# Patient Record
Sex: Female | Born: 1990
Health system: Southern US, Community
[De-identification: ages and names within clinical notes are randomized; demographics above are authoritative.]

## PROBLEM LIST (undated history)

## (undated) DIAGNOSIS — Z975 Presence of (intrauterine) contraceptive device: Secondary | ICD-10-CM

## (undated) DIAGNOSIS — F419 Anxiety disorder, unspecified: Secondary | ICD-10-CM

## (undated) DIAGNOSIS — T7840XA Allergy, unspecified, initial encounter: Secondary | ICD-10-CM

## (undated) DIAGNOSIS — F1010 Alcohol abuse, uncomplicated: Secondary | ICD-10-CM

## (undated) DIAGNOSIS — F1910 Other psychoactive substance abuse, uncomplicated: Secondary | ICD-10-CM

## (undated) DIAGNOSIS — F411 Generalized anxiety disorder: Secondary | ICD-10-CM

## (undated) DIAGNOSIS — K219 Gastro-esophageal reflux disease without esophagitis: Secondary | ICD-10-CM

## (undated) DIAGNOSIS — F329 Major depressive disorder, single episode, unspecified: Secondary | ICD-10-CM

## (undated) DIAGNOSIS — F32A Depression, unspecified: Secondary | ICD-10-CM

## (undated) HISTORY — DX: Anxiety disorder, unspecified: F41.9

## (undated) HISTORY — DX: Major depressive disorder, single episode, unspecified: F32.9

## (undated) HISTORY — DX: Allergy, unspecified, initial encounter: T78.40XA

## (undated) HISTORY — DX: Depression, unspecified: F32.A

## (undated) HISTORY — DX: Alcohol abuse, uncomplicated: F10.10

## (undated) HISTORY — DX: Generalized anxiety disorder: F41.1

## (undated) HISTORY — DX: Presence of (intrauterine) contraceptive device: Z97.5

## (undated) HISTORY — DX: Other psychoactive substance abuse, uncomplicated: F19.10

## (undated) HISTORY — DX: Gastro-esophageal reflux disease without esophagitis: K21.9

## (undated) HISTORY — PX: MOUTH SURGERY: SHX715

---

## 2014-12-14 ENCOUNTER — Emergency Department (HOSPITAL_COMMUNITY)
Admission: EM | Admit: 2014-12-14 | Discharge: 2014-12-14 | Disposition: A | Discharge status: Home / Self Care | Payer: BLUE CROSS/BLUE SHIELD | Attending: Emergency Medicine | Admitting: Emergency Medicine

## 2014-12-14 ENCOUNTER — Encounter (HOSPITAL_COMMUNITY): Payer: Self-pay | Admitting: Family Medicine

## 2014-12-14 DIAGNOSIS — Z3202 Encounter for pregnancy test, result negative: Secondary | ICD-10-CM | POA: Diagnosis not present

## 2014-12-14 DIAGNOSIS — F419 Anxiety disorder, unspecified: Secondary | ICD-10-CM | POA: Diagnosis not present

## 2014-12-14 DIAGNOSIS — E860 Dehydration: Secondary | ICD-10-CM | POA: Diagnosis not present

## 2014-12-14 DIAGNOSIS — M549 Dorsalgia, unspecified: Secondary | ICD-10-CM | POA: Insufficient documentation

## 2014-12-14 DIAGNOSIS — R000 Tachycardia, unspecified: Secondary | ICD-10-CM | POA: Insufficient documentation

## 2014-12-14 DIAGNOSIS — Z880 Allergy status to penicillin: Secondary | ICD-10-CM | POA: Diagnosis not present

## 2014-12-14 DIAGNOSIS — R51 Headache: Secondary | ICD-10-CM | POA: Diagnosis not present

## 2014-12-14 DIAGNOSIS — R509 Fever, unspecified: Secondary | ICD-10-CM | POA: Insufficient documentation

## 2014-12-14 DIAGNOSIS — R1084 Generalized abdominal pain: Secondary | ICD-10-CM | POA: Diagnosis not present

## 2014-12-14 DIAGNOSIS — R42 Dizziness and giddiness: Secondary | ICD-10-CM | POA: Insufficient documentation

## 2014-12-14 DIAGNOSIS — Z8719 Personal history of other diseases of the digestive system: Secondary | ICD-10-CM | POA: Diagnosis not present

## 2014-12-14 DIAGNOSIS — R110 Nausea: Secondary | ICD-10-CM | POA: Insufficient documentation

## 2014-12-14 LAB — COMPREHENSIVE METABOLIC PANEL
ALBUMIN: 4.4 g/dL (ref 3.5–5.0)
ALT: 15 U/L (ref 14–54)
ANION GAP: 7 (ref 5–15)
AST: 21 U/L (ref 15–41)
Alkaline Phosphatase: 63 U/L (ref 38–126)
BUN: 9 mg/dL (ref 6–20)
CHLORIDE: 108 mmol/L (ref 101–111)
CO2: 24 mmol/L (ref 22–32)
Calcium: 9.6 mg/dL (ref 8.9–10.3)
Creatinine, Ser: 0.9 mg/dL (ref 0.44–1.00)
GFR calc Af Amer: >60 mL/min (ref >60)
GFR calc non Af Amer: >60 mL/min (ref >60)
GLUCOSE: 102 mg/dL — AB (ref 65–99)
POTASSIUM: 4 mmol/L (ref 3.5–5.1)
Sodium: 139 mmol/L (ref 135–145)
Total Bilirubin: 0.5 mg/dL (ref 0.3–1.2)
Total Protein: 7.4 g/dL (ref 6.5–8.1)

## 2014-12-14 LAB — CBC WITH DIFFERENTIAL/PLATELET
BASOS ABS: 0 10*3/uL (ref 0.0–0.1)
BASOS PCT: 0 %
Eosinophils Absolute: 0.1 10*3/uL (ref 0.0–0.7)
Eosinophils Relative: 1 %
HEMATOCRIT: 45.3 % (ref 36.0–46.0)
HEMOGLOBIN: 15.4 g/dL — AB (ref 12.0–15.0)
LYMPHS PCT: 28 %
Lymphs Abs: 2.6 10*3/uL (ref 0.7–4.0)
MCH: 32.4 pg (ref 26.0–34.0)
MCHC: 34 g/dL (ref 30.0–36.0)
MCV: 95.2 fL (ref 78.0–100.0)
MONO ABS: 0.6 10*3/uL (ref 0.1–1.0)
Monocytes Relative: 6 %
NEUTROS ABS: 6.2 10*3/uL (ref 1.7–7.7)
NEUTROS PCT: 65 %
Platelets: 233 10*3/uL (ref 150–400)
RBC: 4.76 MIL/uL (ref 3.87–5.11)
RDW: 12.5 % (ref 11.5–15.5)
WBC: 9.5 10*3/uL (ref 4.0–10.5)

## 2014-12-14 LAB — URINALYSIS, ROUTINE W REFLEX MICROSCOPIC
Bilirubin Urine: NEGATIVE
GLUCOSE, UA: NEGATIVE mg/dL
Hgb urine dipstick: NEGATIVE
KETONES UR: 15 mg/dL — AB
LEUKOCYTES UA: NEGATIVE
NITRITE: NEGATIVE
PH: 6.5 (ref 5.0–8.0)
PROTEIN: NEGATIVE mg/dL
Specific Gravity, Urine: 1.007 (ref 1.005–1.030)
Urobilinogen, UA: 0.2 mg/dL (ref 0.0–1.0)

## 2014-12-14 LAB — PREGNANCY, URINE: Preg Test, Ur: NEGATIVE

## 2014-12-14 MED ORDER — DIPHENHYDRAMINE HCL 50 MG/ML IJ SOLN
25.0000 mg | Freq: Once | INTRAMUSCULAR | Status: AC
  Administered 2014-12-14: 25 mg via INTRAVENOUS
  Filled 2014-12-14: qty 1

## 2014-12-14 MED ORDER — SODIUM CHLORIDE 0.9 % IV BOLUS (SEPSIS)
1000.0000 mL | Freq: Once | INTRAVENOUS | Status: AC
  Administered 2014-12-14: 1000 mL via INTRAVENOUS

## 2014-12-14 MED ORDER — METOCLOPRAMIDE HCL 5 MG/ML IJ SOLN
10.0000 mg | Freq: Once | INTRAMUSCULAR | Status: AC
  Administered 2014-12-14: 10 mg via INTRAVENOUS
  Filled 2014-12-14: qty 2

## 2014-12-14 NOTE — ED Notes (Signed)
Patient left at this time with all belongings. 

## 2014-12-14 NOTE — ED Notes (Signed)
Patient reports abdominal pain and nausea since last Thursday, reports generalized "itching" and "trouble focusing" since Monday. Pt alert, oriented, resp e/u, skin warm and dry. Pt ambulated to restroom with no issues, appeared steady on her feet.

## 2014-12-14 NOTE — ED Notes (Signed)
Pt having mid right sided abd pain with fever, confusion and tachycardia. sts some nausea.

## 2014-12-14 NOTE — Discharge Instructions (Signed)

## 2014-12-14 NOTE — ED Notes (Signed)
MD at bedside. 

## 2014-12-14 NOTE — ED Provider Notes (Signed)
CSN: [id number]     Arrival date & time 12/14/14  1448 History   First MD Initiated Contact with Patient 12/14/14 1622     Chief Complaint  Patient presents with  . Fever  . Abdominal Pain     (Consider location/radiation/quality/duration/timing/severity/associated sxs/prior Treatment) Patient is a 24 y.o. female presenting with abdominal pain. The history is provided by the patient.  Abdominal Pain Pain location:  Generalized Pain quality: aching and cramping   Pain severity:  Moderate Onset quality:  Gradual Duration:  6 days Timing:  Intermittent Progression:  Waxing and waning Chronicity:  Recurrent Context: recent sexual activity   Context: not diet changes, not eating, not laxative use, not medication withdrawal, not previous surgeries, not recent illness, not retching, not sick contacts, not suspicious food intake and not trauma   Context comment:  Constipation, stress, reduced PO intake Relieved by:  None tried Worsened by:  Nothing tried Ineffective treatments:  None tried Associated symptoms: chills, constipation, fever and nausea   Associated symptoms: no chest pain, no cough, no diarrhea, no dysuria, no fatigue, no flatus, no hematemesis, no hematochezia, no hematuria, no melena, no shortness of breath, no sore throat, no vaginal bleeding, no vaginal discharge and no vomiting   Risk factors: has not had multiple surgeries, no NSAID use, not obese and not pregnant     History reviewed. No pertinent past medical history. History reviewed. No pertinent past surgical history. History reviewed. No pertinent family history. Social History  Substance Use Topics  . Smoking status: Current Every Day Smoker  . Smokeless tobacco: None  . Alcohol Use: Yes   OB History    No data available     Review of Systems  Constitutional: Positive for fever and chills. Negative for fatigue.  HENT: Negative for sore throat.   Respiratory: Negative for cough, chest tightness and  shortness of breath.   Cardiovascular: Negative for chest pain, palpitations and leg swelling.  Gastrointestinal: Positive for nausea, abdominal pain and constipation. Negative for vomiting, diarrhea, blood in stool, melena, hematochezia, abdominal distention, anal bleeding, rectal pain, flatus and hematemesis.  Genitourinary: Negative for dysuria, hematuria, vaginal bleeding and vaginal discharge.  Musculoskeletal: Positive for back pain. Negative for myalgias, joint swelling, arthralgias, gait problem, neck pain and neck stiffness.  Skin: Negative for rash.  Neurological: Positive for dizziness, light-headedness and headaches. Negative for tremors, seizures, syncope, speech difficulty, weakness and numbness.  Psychiatric/Behavioral: Positive for decreased concentration. Negative for confusion and sleep disturbance. The patient is nervous/anxious.       Allergies  Penicillins  Home Medications   Prior to Admission medications   Medication Sig Start Date End Date Taking? Authorizing Provider  ibuprofen (ADVIL,MOTRIN) 200 MG tablet Take 400 mg by mouth every 6 (six) hours as needed.   Yes Historical Provider, MD  levonorgestrel (MIRENA) 20 MCG/24HR IUD 1 each by Intrauterine route once.   Yes Historical Provider, MD   BP 131/76 mmHg  Pulse 110  Temp(Src) 99.5 F (37.5 C) (Oral)  Resp 16  Ht  (1.676 m)  Wt 181 lb (82.101 kg)  BMI 29.23 kg/m2  SpO2 100% Physical Exam  Constitutional: She is oriented to person, place, and time. She appears well-developed and well-nourished. No distress.  HENT:  Head: Normocephalic and atraumatic.  Right Ear: External ear normal.  Left Ear: External ear normal.  Nose: Nose normal.  Mouth/Throat: Oropharynx is clear and moist. No oropharyngeal exudate.  Eyes: Conjunctivae and EOM are normal. Pupils are equal, round,  and reactive to light. Right eye exhibits no discharge. Left eye exhibits no discharge. No scleral icterus.  Neck: Normal range of  motion. Neck supple. No JVD present. No tracheal deviation present. No thyromegaly present.  Cardiovascular: Regular rhythm and intact distal pulses.  Tachycardia present.  Exam reveals friction rub. Exam reveals no gallop.   No murmur heard. Pulmonary/Chest: Effort normal and breath sounds normal. No stridor. No respiratory distress. She has no wheezes. She has no rales. She exhibits no tenderness.  Abdominal: Soft. She exhibits no distension. There is no tenderness.  Musculoskeletal: Normal range of motion. She exhibits no edema or tenderness.  Lymphadenopathy:    She has no cervical adenopathy.  Neurological: She is alert and oriented to person, place, and time. She is not disoriented. She displays no atrophy and no tremor. No cranial nerve deficit or sensory deficit. She exhibits normal muscle tone. She displays no seizure activity. Coordination and gait normal. GCS eye subscore is 4. GCS verbal subscore is 5. GCS motor subscore is 6.  Skin: Skin is warm and dry. No rash noted. She is not diaphoretic. No erythema. No pallor.  Psychiatric: Her speech is normal and behavior is normal. Judgment and thought content normal. Her mood appears anxious. Cognition and memory are normal.  Pt mildly anxious on exam, improved with discussion  Nursing note and vitals reviewed.   ED Course  Procedures (including critical care time) Labs Review Labs Reviewed  COMPREHENSIVE METABOLIC PANEL - Abnormal; Notable for the following:    Glucose, Bld 102 (*)    All other components within normal limits  URINALYSIS, ROUTINE W REFLEX MICROSCOPIC (NOT [facility]) - Abnormal; Notable for the following:    Ketones, ur 15 (*)    All other components within normal limits  CBC WITH DIFFERENTIAL/PLATELET - Abnormal; Notable for the following:    Hemoglobin 15.4 (*)    All other components within normal limits  PREGNANCY, URINE  POC OCCULT BLOOD, ED     MDM   Final diagnoses:  Dehydration  H/O constipation   Abdominal pain, generalized    Andrea Esparza is a 24 y.o. female patient presenting for recent itching, abd pain, and back pain associated with malaise and dizziness.  She denies CP, or SOB.  Pt has been under more stress lately looking for a new job.  Pt endorses mild nausea.  Hx of constipation in the past associated with stress.  No BRBPR.  No vaginal or urinary symptoms.  No CVA TTP.  No hx of syncope or presyncope.  Suspect possible dehydration and component of possible IBS.  Pt had recent labs at an UC and was "nervous" after looking at her symptoms on [employer] and was worried about liver disease.    Will screen for UTI with low back pain and abd pain, w/ hx of constipation.  IVF, benadryl, and antiemetic for pt symptoms.  Possible d/c w/ atarax or benadryl for her symptoms.  HR improved after talking about concerns.  Doubt PE as pt is w/o CP or SOB.    Symptoms resolved with IVF and MG cocktail.  Pt now w/o puritis or dizziness.  Pt HR improved with IVF.  Recommended increased fiber for IBS symptoms and short course of miralax for recent constipation and mild generalized abd pain.  Patient was given return precautions for abdominal pain and dizziness.  Pt advised on use of medications as applicable.  Advised to return for actely worsening symptoms, inability to take medications, or other acute  concerns.  Advised to follow up with PCP in 1 week.  Patient was in agreement with and expressed understanding of follow plan, plan of care, and return precautions.  All questions answered prior to discharge.  Patient was discharged in stable condition, ambulating without difficulty.   Patient care was discussed with my attending, Dr. Dione Booze, MD.        Gavin Pound, MD 12/15/14 7829  Dione Booze, MD 12/15/14 2258

## 2015-01-04 ENCOUNTER — Encounter: Payer: Self-pay | Admitting: Osteopathic Medicine

## 2015-01-04 ENCOUNTER — Ambulatory Visit (INDEPENDENT_AMBULATORY_CARE_PROVIDER_SITE_OTHER): Payer: BLUE CROSS/BLUE SHIELD | Admitting: Osteopathic Medicine

## 2015-01-04 VITALS — BP 135/73 | HR 98 | Ht 66.0 in | Wt 180.0 lb

## 2015-01-04 DIAGNOSIS — F1010 Alcohol abuse, uncomplicated: Secondary | ICD-10-CM | POA: Diagnosis not present

## 2015-01-04 DIAGNOSIS — Z8249 Family history of ischemic heart disease and other diseases of the circulatory system: Secondary | ICD-10-CM

## 2015-01-04 DIAGNOSIS — Z975 Presence of (intrauterine) contraceptive device: Secondary | ICD-10-CM | POA: Insufficient documentation

## 2015-01-04 DIAGNOSIS — G8929 Other chronic pain: Secondary | ICD-10-CM

## 2015-01-04 DIAGNOSIS — Z113 Encounter for screening for infections with a predominantly sexual mode of transmission: Secondary | ICD-10-CM

## 2015-01-04 DIAGNOSIS — R1010 Upper abdominal pain, unspecified: Secondary | ICD-10-CM

## 2015-01-04 DIAGNOSIS — F329 Major depressive disorder, single episode, unspecified: Secondary | ICD-10-CM

## 2015-01-04 DIAGNOSIS — R1011 Right upper quadrant pain: Secondary | ICD-10-CM

## 2015-01-04 DIAGNOSIS — F411 Generalized anxiety disorder: Secondary | ICD-10-CM

## 2015-01-04 HISTORY — DX: Presence of (intrauterine) contraceptive device: Z97.5

## 2015-01-04 HISTORY — DX: Alcohol abuse, uncomplicated: F10.10

## 2015-01-04 LAB — CBC WITH DIFFERENTIAL/PLATELET
Basophils Absolute: 0 K/uL (ref 0.0–0.1)
Basophils Relative: 0 % (ref 0–1)
Eosinophils Absolute: 0.1 K/uL (ref 0.0–0.7)
Eosinophils Relative: 1 % (ref 0–5)
HCT: 46.9 % — ABNORMAL HIGH (ref 36.0–46.0)
Hemoglobin: 15.7 g/dL — ABNORMAL HIGH (ref 12.0–15.0)
Lymphocytes Relative: 24 % (ref 12–46)
Lymphs Abs: 2.1 K/uL (ref 0.7–4.0)
MCH: 32 pg (ref 26.0–34.0)
MCHC: 33.5 g/dL (ref 30.0–36.0)
MCV: 95.5 fL (ref 78.0–100.0)
MPV: 9.4 fL (ref 8.6–12.4)
Monocytes Absolute: 0.7 K/uL (ref 0.1–1.0)
Monocytes Relative: 8 % (ref 3–12)
Neutro Abs: 6 K/uL (ref 1.7–7.7)
Neutrophils Relative %: 67 % (ref 43–77)
Platelets: 231 K/uL (ref 150–400)
RBC: 4.91 MIL/uL (ref 3.87–5.11)
RDW: 12.7 % (ref 11.5–15.5)
WBC: 8.9 K/uL (ref 4.0–10.5)

## 2015-01-04 LAB — LIPASE: Lipase: 15 U/L (ref 7–60)

## 2015-01-04 LAB — LIPID PANEL
CHOL/HDL RATIO: 3.6 ratio (ref <=5.0)
Cholesterol: 213 mg/dL — ABNORMAL HIGH (ref 125–200)
HDL: 60 mg/dL (ref >=46)
LDL Cholesterol: 138 mg/dL — ABNORMAL HIGH (ref <130)
TRIGLYCERIDES: 76 mg/dL (ref <150)
VLDL: 15 mg/dL (ref <30)

## 2015-01-04 LAB — TSH: TSH: 2.054 u[IU]/mL (ref 0.350–4.500)

## 2015-01-04 MED ORDER — PAROXETINE HCL 10 MG PO TABS: 10.0000 mg | ORAL_TABLET | Freq: Every day | ORAL | Status: DC

## 2015-01-04 NOTE — Progress Notes (Signed)
HPI: Andrea Esparza is a 24 y.o. female who presents to [facility]  today for chief complaint of:  Chief Complaint  Patient presents with  . Establish Care    DISCUSS ANXIETY AND DEPRESSION   ER records reviewed: Patient seen 12/14/2014 or complaint of nausea, abdominal pain, constipation, lightheadedness, decreased concentration and anxiety. Patient was discharged after being treated for dehydration, recommended increased fiber and advice on possible irritable bowel symptoms. Initially seen by nurse at work, c/o fever, was told to go to ER for concern for appendicitis.   Patient notes additional concerns including alcohol use about 12 drinks per week for about 3 years (feels like uses it as crutch to deal with her anxiety), interested in being screened for sexually transmitted disease, concern for anxiety and depression.  Anxiety Evaluation:  Excessive anxiety/worry 6 mos+: Yes  Home and work/school: Yes  Difficulty to control: Yes  Associated symptoms (3):     Restlessness/on edge Yes      Fatigue Yes      Concentration Yes      Irritable Yes      Muscle tension Yes      Sleep disturbance Yes - can't seem to get her mind to shut off, occsionally self-medicate w/ EtOH Imparled social/occupational functioning: Yes  Any secondary cause or other concerns?   Major depression Concern  Personality d/o No concern  Social anxiety d/o No concern  Obsessive Compulsive d/o No concern  PTSD No concern  Eating d/o or Body Dysmorphic d/o Concern, hx of eating disorder in high school  Major Depression Evaluation: One of the following for 2wk, most of the day on most days?  Depression: Yes    Anhedonia/reduced interest: Yes  Four of the following, most of the day on most days?  Weight change unintentional: No   Sleep pattern change: Yes   Psychomotor symptom: No   Fatigue: Yes   Worthlessness/guilt: Yes   Concentration problems: Yes   Thoughts of  death/suicide: No  Concern for other causes?  Drug-induced problems: No   Bipolar disorder: No   Delusional: No   Hallucinations: No   Bereavement/grief: No   Hypothyroid: No   Alcohol: Yes   Previous treatments: Zoloft when younger, never seemed to make a difference, lots of other situational problems at the time. Has been to counseling intermittently.   Abdominal pain:  . Location: generalized but worse in RUQ . Quality: aching, cramping, bloated . Severity: moderate . Duration: several years, worse since 5 days or so . Timing: on and off . Assoc signs/symptoms: constipation      Past medical, social and family history reviewed: Past Medical History  Diagnosis Date  . Alcohol abuse 01/04/2015    Reports 12 drinks per week and self/others concerned about this use   . IUD (intrauterine device) in place 01/04/2015   History reviewed. No pertinent past surgical history. Social History  Substance Use Topics  . Smoking status: Current Every Day Smoker  . Smokeless tobacco: Not on file  . Alcohol Use: Yes   History reviewed. No pertinent family history.  Current Outpatient Prescriptions  Medication Sig Dispense Refill  . ibuprofen (ADVIL,MOTRIN) 200 MG tablet Take 400 mg by mouth every 6 (six) hours as needed.    Marland Kitchen levonorgestrel (MIRENA) 20 MCG/24HR IUD 1 each by Intrauterine route once.     No current facility-administered medications for this visit.   Allergies  Allergen Reactions  . Penicillins Other (See Comments)  Review of Systems: CONSTITUTIONAL:  No  fever, no chills, No  unintentional weight changes HEAD/EYES/EARS/NOSE/THROAT: (+) headache, no vision change, no hearing change, No  sore throat CARDIAC: No chest pain, no pressure/palpitations, no orthopnea RESPIRATORY: ocasional cough, No  shortness of breath/wheeze GASTROINTESTINAL: No nausea, no vomiting, (+) abdominal pain on R side under ribs, no blood in stool, no diarrhea, no  constipation MUSCULOSKELETAL: Yes  Myalgia/arthralgia - aches and spasms in muscles GENITOURINARY: No incontinence, No abnormal genital bleeding/discharge, (+) interested in being tested for STI SKIN: No rash/wounds/concerning lesions HEM/ONC: No easy bruising/bleeding, no abnormal lymph node ENDOCRINE: No polyuria/polydipsia/polyphagia, no heat/cold intolerance  NEUROLOGIC: No weakness, no dizziness, no slurred speech PSYCHIATRIC: (+) concerns with depression, (+) concerns with anxiety, (+) sleep problems    Exam:  BP 135/73 mmHg  Pulse 98  Ht 5\' 6"  (1.676 m)  Wt 180 lb (81.647 kg)  BMI 29.07 kg/m2 Constitutional: VSS, see above. General Appearance: alert, well-developed, well-nourished, NAD Eyes: Normal lids and conjunctive, non-icteric sclera, PERRLA Ears, Nose, Mouth, Throat: Normal external inspection ears/nares/mouth/lips/gums, MMM, posterior pharynx No  erythema No  exudate Neck: No masses, trachea midline. No thyroid enlargement/tenderness/mass appreciated. No lymphadenopathy Respiratory: Normal respiratory effort. no wheeze, no rhonchi, no rales Cardiovascular: S1/S2 normal, no murmur, no rub/gallop auscultated. RRR.  No carotid bruit or JVD. No abdominal aortic bruit.  Pedal pulse II/IV bilaterally DP and PT.  No lower extremity edema. Gastrointestinal: (+)TTP RUQ Neg Murphy's, liver edge palpable just below ribs, no masses. No hepatomegaly, no splenomegaly. No hernia appreciated. Bowel sounds normal. Rectal exam deferred.  Musculoskeletal: Gait normal. No clubbing/cyanosis of digits.  Neurological: No cranial nerve deficit on limited exam. Motor and sensation intact and symmetric Psychiatric: Normal judgment/insight. Normal mood and affect. Oriented x3.    No results found for this or any previous visit (from the past 72 hour(s)). ER labs reviewed: CMP no concerns, CBC mildly elevated hemoglobin, urinalysis normal except positive ketones, pregnancy  negative.   ASSESSMENT/PLAN: Patient meets criteria for major depression and generalized anxiety disorder, starting therapy as below, patient given written instructions, see patient instructions. We will also complete lab workup, ER labs reviewed, no concerns on CMP, mildly elevated hemoglobin on CBC, no thyroid testing or lipase done. Patient also interested in screening for sexually transmitted infection, we'll get these labs done as well. Patient advised on side effects of medication, as well as ER precautions for thoughts of hurting self or others. Patient has good insight into her issues, realizes that she is probably self-medicating with alcohol use, she has a quit date for stopping smoking. Consideration for abdominal complaints due to possible IBS component versus application of anxiety/depression, will rule out right upper quadrant abnormality with ultrasound, CMP reviewed and no concerns.   Major depression, chronic (HCC) - Plan: TSH, CBC with Differential/Platelet, PARoxetine (PAXIL) 10 MG tablet  Generalized anxiety disorder - Plan: TSH, CBC with Differential/Platelet, PARoxetine (PAXIL) 10 MG tablet  IUD (intrauterine device) in place  Alcohol abuse  Screen for STD (sexually transmitted disease) - Plan: HIV antibody, GC/chlamydia probe amp, urine, RPR, Hepatitis panel, acute  Abdominal pain, chronic, right upper quadrant - Plan: CBC with Differential/Platelet, Lipase, US Abdomen Limited RUQ  Family history of heart disease - Plan: Lipid panel   Return in about 4 weeks (around 02/01/2015), or if symptoms worsen or fail to improve, for MEDICATION MANAGEMENT.

## 2015-01-04 NOTE — Patient Instructions (Signed)
We are starting a medication for anxiety/depression called Paxil also known as Paroxetine. We are starting at 10 mg dose for 2 weeks, then can increase to 20 mg (2 pills per day) taken in the evening. After 4-6 weeks, please follow up in the office so we can discuss how the medication is doing for you and consider dose adjustment if needed. If you're having problems with the medication before then, please let Dr. Lyn HollingsheadAlexander know right away.   You should hear about scheduling the ultrasound of the abdomen, if you do not hear about this in the next few days, please call the office.

## 2015-01-05 LAB — HEPATITIS PANEL, ACUTE
HCV AB: NEGATIVE
HEP A IGM: NONREACTIVE
HEP B C IGM: NONREACTIVE
Hepatitis B Surface Ag: NEGATIVE

## 2015-01-05 LAB — GC/CHLAMYDIA PROBE AMP, URINE
Chlamydia, Swab/Urine, PCR: NEGATIVE
GC Probe Amp, Urine: NEGATIVE

## 2015-01-05 LAB — HIV ANTIBODY (ROUTINE TESTING W REFLEX): HIV: NONREACTIVE

## 2015-01-05 LAB — RPR

## 2015-01-09 ENCOUNTER — Ambulatory Visit (INDEPENDENT_AMBULATORY_CARE_PROVIDER_SITE_OTHER): Payer: BLUE CROSS/BLUE SHIELD

## 2015-01-09 DIAGNOSIS — R1010 Upper abdominal pain, unspecified: Secondary | ICD-10-CM

## 2015-01-09 DIAGNOSIS — G8929 Other chronic pain: Secondary | ICD-10-CM

## 2015-01-09 DIAGNOSIS — R110 Nausea: Secondary | ICD-10-CM

## 2015-01-09 DIAGNOSIS — R1011 Right upper quadrant pain: Principal | ICD-10-CM

## 2015-01-18 ENCOUNTER — Encounter: Payer: Self-pay | Admitting: Osteopathic Medicine

## 2015-01-18 ENCOUNTER — Ambulatory Visit (INDEPENDENT_AMBULATORY_CARE_PROVIDER_SITE_OTHER): Payer: BLUE CROSS/BLUE SHIELD | Admitting: Osteopathic Medicine

## 2015-01-18 VITALS — BP 117/41 | HR 86 | Ht 66.0 in | Wt 182.0 lb

## 2015-01-18 DIAGNOSIS — F329 Major depressive disorder, single episode, unspecified: Secondary | ICD-10-CM

## 2015-01-18 DIAGNOSIS — F411 Generalized anxiety disorder: Secondary | ICD-10-CM

## 2015-01-18 DIAGNOSIS — R079 Chest pain, unspecified: Secondary | ICD-10-CM | POA: Diagnosis not present

## 2015-01-18 MED ORDER — ESCITALOPRAM OXALATE 20 MG PO TABS: 10.0000 mg | ORAL_TABLET | Freq: Every day | ORAL | Status: DC

## 2015-01-18 NOTE — Progress Notes (Signed)
HPI: Andrea ClarityCaitlin Esparza is a 24 y.o. female who [facility] [place] for chief complaint of:  Chief Complaint  Patient presents with  . Chest Pain    . Location: chest . Quality: soreness . Severity:  mild . Duration: 1 day  . Timing: intermittent . Context: recently started Paxil for anxiety/depression. Chest pain listed as side effect in 3% acc to [employer]. Chest pain last night at rest, nagging soreness . Assoc signs/symptoms: no SOB, no dizziness, no LOC. Noticed tension in muscles since Paxil started noticeable in neck and shoulders.    Past medical, social and family history reviewed: Past Medical History  Diagnosis Date  . Alcohol abuse 01/04/2015    Reports 12 drinks per week and self/others concerned about this use   . IUD (intrauterine device) in place 01/04/2015   Past Surgical History  Procedure Laterality Date  . Mouth surgery  2004/2005   Social History  Substance Use Topics  . Smoking status: Current Every Day Smoker  . Smokeless tobacco: Not on file  . Alcohol Use: Yes   Family History  Problem Relation Age of Onset  . Depression Father   . Alcohol abuse Maternal Aunt   . Alcohol abuse Maternal Uncle   . Cancer Maternal Grandfather   . Diabetes Paternal Grandmother   . Alcohol abuse Paternal Grandfather   . Heart attack Paternal Grandfather     Current Outpatient Prescriptions  Medication Sig Dispense Refill  . levonorgestrel (MIRENA) 20 MCG/24HR IUD 1 each by Intrauterine route once.    Marland Kitchen. PARoxetine (PAXIL) 10 MG tablet Take 1 tablet (10 mg total) by mouth daily. 30 tablet 1   No current facility-administered medications for this visit.   Allergies  Allergen Reactions  . Penicillins Other (See Comments)      Review of Systems: CONSTITUTIONAL:  No  fever, no chills, No  unintentional weight changes CARDIAC: (+) chest pain as per HPI, No  pressure, No palpitations,  RESPIRATORY: No  cough, No  shortness  of breath/wheeze PSYCHIATRIC: (+) concerns with depression, (+) concerns with anxiety, some sleep problems     Exam:  BP 117/41 mmHg  Pulse 86  Ht 5\' 6"  (1.676 m)  Wt 182 lb (82.555 kg)  BMI 29.39 kg/m2 Constitutional: VS see above. General Appearance: alert, well-developed, well-nourished, NAD Eyes: Normal lids and conjunctive, non-icteric sclera Respiratory: Normal respiratory effort. no wheeze, no rhonchi, no rales Cardiovascular: S1/S2 normal, no murmur, no rub/gallop auscultated. RRR.  Psychiatric: Normal judgment/insight. Normal mood and affect. Oriented x3.     EKG interpretation: Rate: 92 Rhythm: sinus No ST/T changes concerning for acute ischemia/infarct    ASSESSMENT/PLAN: Consideration that chest pain may be due to early viral syndrome vs anxiety issues but will try switch to Lexapro and see if this helps, ER precautions reviewed.   Chest pain, unspecified chest pain type - Plan: EKG 12-Lead  Major depression, chronic (HCC) - Plan: escitalopram (LEXAPRO) 20 MG tablet  Generalized anxiety disorder - Plan: escitalopram (LEXAPRO) 20 MG tablet     Return in about 4 weeks (around 02/15/2015), or if symptoms worsen or fail to improve.

## 2015-01-19 ENCOUNTER — Ambulatory Visit: Payer: BLUE CROSS/BLUE SHIELD | Admitting: Osteopathic Medicine

## 2015-02-01 ENCOUNTER — Ambulatory Visit (INDEPENDENT_AMBULATORY_CARE_PROVIDER_SITE_OTHER): Payer: BLUE CROSS/BLUE SHIELD | Admitting: Osteopathic Medicine

## 2015-02-01 DIAGNOSIS — F329 Major depressive disorder, single episode, unspecified: Secondary | ICD-10-CM

## 2015-02-01 DIAGNOSIS — F411 Generalized anxiety disorder: Secondary | ICD-10-CM

## 2015-02-01 NOTE — Progress Notes (Signed)
NO SHOW

## 2015-04-01 ENCOUNTER — Other Ambulatory Visit: Payer: Self-pay | Admitting: Osteopathic Medicine

## 2015-04-27 ENCOUNTER — Encounter: Payer: Self-pay | Admitting: Osteopathic Medicine

## 2015-04-27 ENCOUNTER — Ambulatory Visit (INDEPENDENT_AMBULATORY_CARE_PROVIDER_SITE_OTHER): Payer: BLUE CROSS/BLUE SHIELD | Admitting: Osteopathic Medicine

## 2015-04-27 ENCOUNTER — Ambulatory Visit (INDEPENDENT_AMBULATORY_CARE_PROVIDER_SITE_OTHER): Payer: BLUE CROSS/BLUE SHIELD

## 2015-04-27 VITALS — BP 129/82 | HR 99 | Ht 66.0 in | Wt 183.0 lb

## 2015-04-27 DIAGNOSIS — R05 Cough: Secondary | ICD-10-CM

## 2015-04-27 DIAGNOSIS — R059 Cough, unspecified: Secondary | ICD-10-CM

## 2015-04-27 DIAGNOSIS — F411 Generalized anxiety disorder: Secondary | ICD-10-CM

## 2015-04-27 DIAGNOSIS — F329 Major depressive disorder, single episode, unspecified: Secondary | ICD-10-CM | POA: Diagnosis not present

## 2015-04-27 DIAGNOSIS — M940 Chondrocostal junction syndrome [Tietze]: Secondary | ICD-10-CM

## 2015-04-27 DIAGNOSIS — F17203 Nicotine dependence unspecified, with withdrawal: Secondary | ICD-10-CM

## 2015-04-27 DIAGNOSIS — Z720 Tobacco use: Secondary | ICD-10-CM | POA: Insufficient documentation

## 2015-04-27 HISTORY — DX: Chondrocostal junction syndrome (tietze): M94.0

## 2015-04-27 HISTORY — DX: Cough, unspecified: R05.9

## 2015-04-27 HISTORY — DX: Nicotine dependence unspecified, with withdrawal: F17.203

## 2015-04-27 HISTORY — DX: Major depressive disorder, single episode, unspecified: F32.9

## 2015-04-27 HISTORY — DX: Generalized anxiety disorder: F41.1

## 2015-04-27 MED ORDER — ESCITALOPRAM OXALATE 20 MG PO TABS: 20.0000 mg | ORAL_TABLET | Freq: Every day | ORAL | Status: DC

## 2015-04-27 MED ORDER — BUPROPION HCL ER (SR) 150 MG PO TB12: 150.0000 mg | ORAL_TABLET | Freq: Two times a day (BID) | ORAL | Status: DC

## 2015-04-27 MED ORDER — FLUTICASONE PROPIONATE HFA 110 MCG/ACT IN AERO: 2.0000 | INHALATION_SPRAY | Freq: Two times a day (BID) | RESPIRATORY_TRACT | Status: DC

## 2015-04-27 NOTE — Patient Instructions (Signed)
SMOKING CESSATION WITH BUPROPION: 150 mg twice per day. Last dose no later than 6:00pm. Stop smoking after 5 - 7 days of treatment. See below for more information on quitting!   COUGH: Xray today, try inhaler, this may help as you are quitting smoking. This is an inhaled steroid, remember to rinse your mouth after use to prevent thrush. If cough persists, let me know and we may need to consider lung function tests or other workup.   COSTOCHONDRITIS: Take antiinflammatory such as Ibuprofen, can try 200 mg every 6 hours during the day for 1 - 2 weeks, or can use higher doses 800 mg up to every 6 hours as needed fo revere discomfort. Any severe chest pain (particularly if you also experience trouble breathing, dizziness, feeling faint, or any other concerning symptoms) needs prompt medical attention.    Smoking Cessation, Tips for Success If you are ready to quit smoking, congratulations! You have chosen to help yourself be healthier. Cigarettes bring nicotine, tar, carbon monoxide, and other irritants into your body. Your lungs, heart, and blood vessels will be able to work better without these poisons. There are many different ways to quit smoking. Nicotine gum, nicotine patches, a nicotine inhaler, or nicotine nasal spray can help with physical craving. Hypnosis, support groups, and medicines help break the habit of smoking. WHAT THINGS CAN I DO TO MAKE QUITTING EASIER?  Here are some tips to help you quit for good:  Pick a date when you will quit smoking completely. Tell all of your friends and family about your plan to quit on that date.  Do not try to slowly cut down on the number of cigarettes you are smoking. Pick a quit date and quit smoking completely starting on that day.  Throw away all cigarettes.   Clean and remove all ashtrays from your home, work, and car.  On a card, write down your reasons for quitting. Carry the card with you and read it when you get the urge to smoke.  Cleanse  your body of nicotine. Drink enough water and fluids to keep your urine clear or pale yellow. Do this after quitting to flush the nicotine from your body.  Learn to predict your moods. Do not let a bad situation be your excuse to have a cigarette. Some situations in your life might tempt you into wanting a cigarette.  Never have "just one" cigarette. It leads to wanting another and another. Remind yourself of your decision to quit.  Change habits associated with smoking. If you smoked while driving or when feeling stressed, try other activities to replace smoking. Stand up when drinking your coffee. Brush your teeth after eating. Sit in a different chair when you read the paper. Avoid alcohol while trying to quit, and try to drink fewer caffeinated beverages. Alcohol and caffeine may urge you to smoke.  Avoid foods and drinks that can trigger a desire to smoke, such as sugary or spicy foods and alcohol.  Ask people who smoke not to smoke around you.  Have something planned to do right after eating or having a cup of coffee. For example, plan to take a walk or exercise.  Try a relaxation exercise to calm you down and decrease your stress. Remember, you may be tense and nervous for the first 2 weeks after you quit, but this will pass.  Find new activities to keep your hands busy. Play with a pen, coin, or rubber band. Doodle or draw things on paper.  Brush  your teeth right after eating. This will help cut down on the craving for the taste of tobacco after meals. You can also try mouthwash.   Use oral substitutes in place of cigarettes. Try using lemon drops, carrots, cinnamon sticks, or chewing gum. Keep them handy so they are available when you have the urge to smoke.  When you have the urge to smoke, try deep breathing.  Designate your home as a nonsmoking area.  If you are a heavy smoker, ask your health care provider about a prescription for nicotine chewing gum. It can ease your  withdrawal from nicotine.  Reward yourself. Set aside the cigarette money you save and buy yourself something nice.  Look for support from others. Join a support group or smoking cessation program. Ask someone at home or at work to help you with your plan to quit smoking.  Always ask yourself, "Do I need this cigarette or is this just a reflex?" Tell yourself, "Today, I choose not to smoke," or "I do not want to smoke." You are reminding yourself of your decision to quit.  Do not replace cigarette smoking with electronic cigarettes (commonly called e-cigarettes). The safety of e-cigarettes is unknown, and some may contain harmful chemicals.  If you relapse, do not give up! Plan ahead and think about what you will do the next time you get the urge to smoke. HOW WILL I FEEL WHEN I QUIT SMOKING? You may have symptoms of withdrawal because your body is used to nicotine (the addictive substance in cigarettes). You may crave cigarettes, be irritable, feel very hungry, cough often, get headaches, or have difficulty concentrating. The withdrawal symptoms are only temporary. They are strongest when you first quit but will go away within 10-14 days. When withdrawal symptoms occur, stay in control. Think about your reasons for quitting. Remind yourself that these are signs that your body is healing and getting used to being without cigarettes. Remember that withdrawal symptoms are easier to treat than the major diseases that smoking can cause.  Even after the withdrawal is over, expect periodic urges to smoke. However, these cravings are generally short lived and will go away whether you smoke or not. Do not smoke! WHAT RESOURCES ARE AVAILABLE TO HELP ME QUIT SMOKING? Your health care provider can direct you to community resources or hospitals for support, which may include:  Group support.  Education.  Hypnosis.  Therapy.   This information is not intended to replace advice given to you by your health  care provider. Make sure you discuss any questions you have with your health care provider.   Document Released: 10/20/2003 Document Revised: 02/11/2014 Document Reviewed: 07/09/2012 Elsevier Interactive Patient Education 2016 [employer]   Bupropion sustained-release tablets (smoking cessation) What is this medicine? BUPROPION (byoo PROE pee on) is used to help people quit smoking. This medicine may be used for other purposes; ask your health care provider or pharmacist if you have questions. What should I tell my health care provider before I take this medicine? They need to know if you have any of these conditions: -an eating disorder, such as anorexia or bulimia -bipolar disorder or psychosis -diabetes or high blood sugar, treated with medication -glaucoma -head injury or brain tumor -heart disease, previous heart attack, or irregular heart beat -high blood pressure -kidney or liver disease -seizures -suicidal thoughts or a previous suicide attempt -Tourette's syndrome -weight loss -an unusual or allergic reaction to bupropion, other medicines, foods, dyes, or preservatives -breast-feeding -pregnant or  trying to become pregnant How should I use this medicine? Take this medicine by mouth with a glass of water. Follow the directions on the prescription label. You can take it with or without food. If it upsets your stomach, take it with food. Do not cut, crush or chew this medicine. Take your medicine at regular intervals. If you take this medicine more than once a day, take your second dose at least 8 hours after you take your first dose. To limit difficulty in sleeping, avoid taking this medicine at bedtime. Do not take your medicine more often than directed. Do not stop taking this medicine suddenly except upon the advice of your doctor. Stopping this medicine too quickly may cause serious side effects. A special MedGuide will be given to you by the pharmacist with each  prescription and refill. Be sure to read this information carefully each time. Talk to your pediatrician regarding the use of this medicine in children. Special care may be needed. Overdosage: If you think you have taken too much of this medicine contact a poison control center or emergency room at once. NOTE: This medicine is only for you. Do not share this medicine with others. What if I miss a dose? If you miss a dose, skip the missed dose and take your next tablet at the regular time. There should be at least 8 hours between doses. Do not take double or extra doses. What may interact with this medicine? Do not take this medicine with any of the following medications: -linezolid -MAOIs like Azilect, Carbex, Eldepryl, Marplan, Nardil, and Parnate -methylene blue (injected into a vein) -other medicines that contain bupropion like Wellbutrin This medicine may also interact with the following medications: -alcohol -certain medicines for anxiety or sleep -certain medicines for blood pressure like metoprolol, propranolol -certain medicines for depression or psychotic disturbances -certain medicines for HIV or AIDS like efavirenz, lopinavir, nelfinavir, ritonavir -certain medicines for irregular heart beat like propafenone, flecainide -certain medicines for Parkinson's disease like amantadine, levodopa -certain medicines for seizures like carbamazepine, phenytoin, phenobarbital -cimetidine -clopidogrel -cyclophosphamide -furazolidone -isoniazid -nicotine -orphenadrine -procarbazine -steroid medicines like prednisone or cortisone -stimulant medicines for attention disorders, weight loss, or to stay awake -tamoxifen -theophylline -thiotepa -ticlopidine -tramadol -warfarin This list may not describe all possible interactions. Give your health care provider a list of all the medicines, herbs, non-prescription drugs, or dietary supplements you use. Also tell them if you smoke, drink  alcohol, or use illegal drugs. Some items may interact with your medicine. What should I watch for while using this medicine? Visit your doctor or health care professional for regular checks on your progress. This medicine should be used together with a patient support program. It is important to participate in a behavioral program, counseling, or other support program that is recommended by your health care professional. Patients and their families should watch out for new or worsening thoughts of suicide or depression. Also watch out for sudden changes in feelings such as feeling anxious, agitated, panicky, irritable, hostile, aggressive, impulsive, severely restless, overly excited and hyperactive, or not being able to sleep. If this happens, especially at the beginning of treatment or after a change in dose, call your health care professional. Avoid alcoholic drinks while taking this medicine. Drinking excessive alcoholic beverages, using sleeping or anxiety medicines, or quickly stopping the use of these agents while taking this medicine may increase your risk for a seizure. Do not drive or use heavy machinery until you know how this medicine affects  you. This medicine can impair your ability to perform these tasks. Do not take this medicine close to bedtime. It may prevent you from sleeping. Your mouth may get dry. Chewing sugarless gum or sucking hard candy, and drinking plenty of water may help. Contact your doctor if the problem does not go away or is severe. Do not use nicotine patches or chewing gum without the advice of your doctor or health care professional while taking this medicine. You may need to have your blood pressure taken regularly if your doctor recommends that you use both nicotine and this medicine together. What side effects may I notice from receiving this medicine? Side effects that you should report to your doctor or health care professional as soon as possible: -allergic  reactions like skin rash, itching or hives, swelling of the face, lips, or tongue -breathing problems -changes in vision -confusion -fast or irregular heartbeat -hallucinations -increased blood pressure -redness, blistering, peeling or loosening of the skin, including inside the mouth -seizures -suicidal thoughts or other mood changes -unusually weak or tired -vomiting Side effects that usually do not require medical attention (report to your doctor or health care professional if they continue or are bothersome): -change in sex drive or performance -constipation -headache -loss of appetite -nausea -tremors -weight loss This list may not describe all possible side effects. Call your doctor for medical advice about side effects. You may report side effects to FDA at 1-800-FDA-1088. Where should I keep my medicine? Keep out of the reach of children. Store at room temperature between 20 and 25 degrees C (68 and 77 degrees F). Protect from light. Keep container tightly closed. Throw away any unused medicine after the expiration date. NOTE: This sheet is a summary. It may not cover all possible information. If you have questions about this medicine, talk to your doctor, pharmacist, or health care provider.    2016, Elsevier/Gold Standard. (2012-09-18 10:55:10)

## 2015-04-27 NOTE — Progress Notes (Signed)
HPI: Andrea Esparza is a 25 y.o. female who presents to [facility] today for chief complaint of:  Chief Complaint  Patient presents with  . Follow-up    LEXAPRO MEDICATION   SOB . Quality: SOB, thought related to allergies but getting worse . Duration: 1 week ago,   . Context: (+) smoker  . Modifying factors: seemed correlated to cigarette use . Assoc signs/symptoms: still feels breathless, still coughing. Some nausea and headache. Trying to cut back on smoking and maybe having some withdrawal symptoms. Occasional feeling like heart beating faster.    CHEST DISCOMFORT . Location: between ribs on L upper chest . Quality: sore/sharp when touched . Timing: intermittnet, rare . Modifying factors: hurts worse with putting pressure on chest, touching chest  . Assoc signs/symptoms: no dizziness, chest pressure, no ha/VC no LOC  ANXIETY - better on Lexapro, would like to conitnue thismedicine  TOBACCO ABUSE - trying to quit and would like help with this. Has tried nicotine replacement in the past but not helpful. Has cut back a bit so far.   Note: Was no-show for follow-up appointment 01/2015 not seen since that time.     Past medical, social and family history reviewed: Past Medical History  Diagnosis Date  . Alcohol abuse 01/04/2015    Reports 12 drinks per week and self/others concerned about this use   . IUD (intrauterine device) in place 01/04/2015  . Generalized anxiety disorder 04/27/2015  . Major depression, chronic (HCC) 04/27/2015   Past Surgical History  Procedure Laterality Date  . Mouth surgery  2004/2005   Social History  Substance Use Topics  . Smoking status: Current Every Day Smoker  . Smokeless tobacco: Not on file  . Alcohol Use: Yes   Family History  Problem Relation Age of Onset  . Depression Father   . Alcohol abuse Maternal Aunt   . Alcohol abuse Maternal Uncle   . Cancer Maternal Grandfather   . Diabetes Paternal  Grandmother   . Alcohol abuse Paternal Grandfather   . Heart attack Paternal Grandfather     Current Outpatient Prescriptions  Medication Sig Dispense Refill  . escitalopram (LEXAPRO) 20 MG tablet Take 1 tablet (20 mg total) by mouth daily. FOLLOW UP APPOINTMENT NEEDED FOR FURTHER REFILLS 14 tablet 0  . levonorgestrel (MIRENA) 20 MCG/24HR IUD 1 each by Intrauterine route once.     No current facility-administered medications for this visit.   Allergies  Allergen Reactions  . Penicillins Other (See Comments)      Review of Systems: CONSTITUTIONAL:  No  fever, no chills, No  unintentional weight changes HEAD/EYES/EARS/NOSE/THROAT: No  headache, no vision change, no hearing change, No  sore throat, (+) sinus pressure/drainage CARDIAC: (+) chest discomfort as per HPI , No  pressure, No palpitations, No  orthopnea RESPIRATORY: No  cough, No  shortness of breath/wheeze GASTROINTESTINAL: (+) occasional nausea, No  vomiting, No  abdominal pain, MUSCULOSKELETAL: No  Myalgia/arthralgia except chest discomfort with touching/pressure  NEUROLOGIC: No  weakness, No  dizziness, No  slurred speech PSYCHIATRIC: (+)concerns with depression, (+)concerns with anxiety, No sleep problems - reports improvement on Lexapro  Exam:  BP 129/82 mmHg  Pulse 99  Ht  (1.676 m)  Wt 183 lb (83.008 kg)  BMI 29.55 kg/m2 Constitutional: VS see above. General Appearance: alert, well-developed, well-nourished, NAD Eyes: Normal lids and conjunctive, non-icteric sclera,  Ears, Nose, Mouth, Throat: MMM, Normal external inspection ears/nares/mouth/lips/gums, TM normal bilaterally. Pharynx no erythema, no exudate.  Neck: No masses, trachea midline. No thyroid enlargement/tenderness/mass appreciated. No lymphadenopathy Respiratory: Normal respiratory effort. no wheeze, no rhonchi, no rales Cardiovascular: S1/S2 normal, no murmur, no rub/gallop auscultated. RRR. No lower extremity edema. (+) TTP intercostals on L and  at sternal border c/w costochondritis Psychiatric: Normal judgment/insight. Normal mood and affect. Oriented x3.    No results found for this or any previous visit (from the past 72 hour(s)).    ASSESSMENT/PLAN:  Major depression, chronic (HCC) - Table on Lexapro, patient doing well - Plan: escitalopram (LEXAPRO) 20 MG tablet  Generalized anxiety disorder - Table on Lexapro, patient doing well - Plan: escitalopram (LEXAPRO) 20 MG tablet  Tobacco abuse - Patient information printed, starting on Wellbutrin - Plan: buPROPion (WELLBUTRIN SR) 150 MG 12 hr tablet  Tobacco withdrawal  Cough - X-ray pending, trial ICS, patient quitting smoking, ER/RTC precautions reviewed - Plan: DG Chest 2 View, fluticasone (FLOVENT HFA) 110 MCG/ACT inhaler  Costochondritis - NSAIDs recommended, ER precautions reviewed   Return in about 6 weeks (around 06/08/2015) for MEDICATION MANAGEMENT, RECHECK BREATHING AND PROGRESS ON QUITTING SMOKING.

## 2015-05-17 ENCOUNTER — Ambulatory Visit: Payer: BLUE CROSS/BLUE SHIELD | Admitting: Osteopathic Medicine

## 2015-06-20 ENCOUNTER — Ambulatory Visit (INDEPENDENT_AMBULATORY_CARE_PROVIDER_SITE_OTHER): Payer: BLUE CROSS/BLUE SHIELD | Admitting: Osteopathic Medicine

## 2015-06-20 ENCOUNTER — Encounter: Payer: Self-pay | Admitting: Osteopathic Medicine

## 2015-06-20 VITALS — BP 115/75 | HR 80 | Ht 66.0 in | Wt 190.0 lb

## 2015-06-20 DIAGNOSIS — Z720 Tobacco use: Secondary | ICD-10-CM

## 2015-06-20 DIAGNOSIS — F329 Major depressive disorder, single episode, unspecified: Secondary | ICD-10-CM | POA: Diagnosis not present

## 2015-06-20 DIAGNOSIS — R05 Cough: Secondary | ICD-10-CM

## 2015-06-20 DIAGNOSIS — R059 Cough, unspecified: Secondary | ICD-10-CM

## 2015-06-20 DIAGNOSIS — F411 Generalized anxiety disorder: Secondary | ICD-10-CM

## 2015-06-20 NOTE — Progress Notes (Signed)
HPI: Andrea Esparza is a 25 y.o. female who presents to [facility] today for chief complaint of:  Chief Complaint  Patient presents with  . Follow-up    Depression medication   TOBACCO ABUSE - trying to quit and would like help with this. Has tried nicotine replacement in the past but not helpful. Has cut back a bit so far. Was on Wellbutrin started last visit for about 3 weeks but wasn't noticing much difference, then ended up starting smoking again  COUGH/SOB - Follow-up from previous visit . Quality: SOB, thought related to allergies but was getting worse,  . Context: (+) smoker  . Modifying factors: seemed correlated to cigarette use, as was a bit better with use of inhaler, overall is improved and patient isn't worried about cough/SOB  ANXIETY - better on Lexapro, would like to conitnue this medicine       Past medical, social and family history reviewed: Past Medical History  Diagnosis Date  . Alcohol abuse 01/04/2015    Reports 12 drinks per week and self/others concerned about this use   . IUD (intrauterine device) in place 01/04/2015  . Generalized anxiety disorder 04/27/2015  . Major depression, chronic (HCC) 04/27/2015   Past Surgical History  Procedure Laterality Date  . Mouth surgery  2004/2005   Social History  Substance Use Topics  . Smoking status: Current Every Day Smoker  . Smokeless tobacco: Not on file  . Alcohol Use: Yes   Family History  Problem Relation Age of Onset  . Depression Father   . Alcohol abuse Maternal Aunt   . Alcohol abuse Maternal Uncle   . Cancer Maternal Grandfather   . Diabetes Paternal Grandmother   . Alcohol abuse Paternal Grandfather   . Heart attack Paternal Grandfather     Current Outpatient Prescriptions  Medication Sig Dispense Refill  . buPROPion (WELLBUTRIN SR) 150 MG 12 hr tablet Take 1 tablet (150 mg total) by mouth 2 (two) times daily. 60 tablet 1  . escitalopram (LEXAPRO) 20 MG  tablet Take 1 tablet (20 mg total) by mouth daily. 30 tablet 6  . fluticasone (FLOVENT HFA) 110 MCG/ACT inhaler Inhale 2 puffs into the lungs 2 (two) times daily. For cough 1 Inhaler 0  . levonorgestrel (MIRENA) 20 MCG/24HR IUD 1 each by Intrauterine route once.     No current facility-administered medications for this visit.   Allergies  Allergen Reactions  . Penicillins Other (See Comments)      Review of Systems: CONSTITUTIONAL:  No  fever, no chills, No  unintentional weight changes HEAD/EYES/EARS/NOSE/THROAT: No  headache, no vision change RESPIRATORY: No  cough, No  shortness of breath/wheeze PSYCHIATRIC: (+)concerns with depression, (+)concerns with anxiety, No sleep problems - reports improvement on Lexapro  Exam:  BP 115/75 mmHg  Pulse 80  Ht  (1.676 m)  Wt 190 lb (86.183 kg)  BMI 30.68 kg/m2 Constitutional: VS see above. General Appearance: alert, well-developed, well-nourished, NAD Ears, Nose, Mouth, Throat: MMM, Normal external inspection ears/nares/mouth/lips/gums.  Neck: No masses, trachea midline. Respiratory: Normal respiratory effort. no wheeze, no rhonchi, no rales Cardiovascular: S1/S2 normal Psychiatric: Normal judgment/insight. Normal mood and affect. Oriented x3.       ASSESSMENT/PLAN:  Major depression, chronic (HCC) - Doing well on Lexapro, continue  Generalized anxiety disorder - Doing well on Lexapro, continue  Tobacco abuse - Started again after she stopped the Wellbutrin, advised can try again with the Wellbutrin and stay on this for at least  12 weeks  Cough - Mostly resolved, inhalers were helping, patient started smoking again. Consider PFT, most likely post-bronchitis cough/smoker cough   Return in about 5 months (around 11/20/2015) for PAP & MEDICATION FOLLOWUP.

## 2015-07-12 ENCOUNTER — Ambulatory Visit (INDEPENDENT_AMBULATORY_CARE_PROVIDER_SITE_OTHER): Payer: BLUE CROSS/BLUE SHIELD | Admitting: Osteopathic Medicine

## 2015-07-12 ENCOUNTER — Encounter: Payer: Self-pay | Admitting: Osteopathic Medicine

## 2015-07-12 VITALS — BP 135/82 | HR 98 | Ht 66.0 in | Wt 189.0 lb

## 2015-07-12 DIAGNOSIS — S91311A Laceration without foreign body, right foot, initial encounter: Secondary | ICD-10-CM

## 2015-07-12 DIAGNOSIS — S91319A Laceration without foreign body, unspecified foot, initial encounter: Secondary | ICD-10-CM | POA: Insufficient documentation

## 2015-07-12 HISTORY — DX: Laceration without foreign body, unspecified foot, initial encounter: S91.319A

## 2015-07-12 NOTE — Progress Notes (Signed)
HPI: Andrea Esparza is a 25 y.o. female who [facility] [place] today for chief complaint of:  Chief Complaint  Patient presents with  . Laceration    TOP ON RIGHT FOOT    LACERATION . Context: lacerated on metal bedframe . Location: dorsal R foot . Duration: happened >7 days ago . Modifying factors: has been using peroxide and bandaging.  . Assoc signs/symptoms: no drainage, some tingling/swelling around the laceration   Past medical, social and family history reviewed: Past Medical History  Diagnosis Date  . Alcohol abuse 01/04/2015    Reports 12 drinks per week and self/others concerned about this use   . IUD (intrauterine device) in place 01/04/2015  . Generalized anxiety disorder 04/27/2015  . Major depression, chronic (HCC) 04/27/2015   Past Surgical History  Procedure Laterality Date  . Mouth surgery  2004/2005   Social History  Substance Use Topics  . Smoking status: Current Every Day Smoker  . Smokeless tobacco: Not on file  . Alcohol Use: Yes   Family History  Problem Relation Age of Onset  . Depression Father   . Alcohol abuse Maternal Aunt   . Alcohol abuse Maternal Uncle   . Cancer Maternal Grandfather   . Diabetes Paternal Grandmother   . Alcohol abuse Paternal Grandfather   . Heart attack Paternal Grandfather     Current Outpatient Prescriptions  Medication Sig Dispense Refill  . buPROPion (WELLBUTRIN SR) 150 MG 12 hr tablet Take 1 tablet (150 mg total) by mouth 2 (two) times daily. 60 tablet 1  . escitalopram (LEXAPRO) 20 MG tablet Take 1 tablet (20 mg total) by mouth daily. 30 tablet 6  . fluticasone (FLOVENT HFA) 110 MCG/ACT inhaler Inhale 2 puffs into the lungs 2 (two) times daily. For cough 1 Inhaler 0  . levonorgestrel (MIRENA) 20 MCG/24HR IUD 1 each by Intrauterine route once.     No current facility-administered medications for this visit.   Allergies  Allergen Reactions  . Penicillins Other (See  Comments)      Review of Systems: CONSTITUTIONAL:  No  fever, no chills, No recent illness, No unintentional weight changes SKIN: No  rash/wounds/concerning lesions other than laceration as per HPI MSK: no joint pain or swelling, no fall   Exam:  BP 135/82 mmHg  Pulse 98  Ht 5\' 6"  (1.676 m)  Wt 189 lb (85.73 kg)  BMI 30.52 kg/m2 Constitutional: VS see above. General Appearance: alert, well-developed, well-nourished, NAD Musculoskeletal: Gait normal. No clubbing/cyanosis of digits. Able to move all toes normally R foot, R ankle normal ROM.  Skin: 3 cm laceration on dorsal R foot, 0.5cm wide, healing by secondary intention, granulation tissue present. Nondraining. Faint erythema around border of laceration c/w normal healing, no extended erythema or warmth or streaking. Skin otherwise warm, dry, intact. No rash/ulcer. No concerning nevi or subq nodules on limited exam.   Psychiatric: Normal judgment/insight. Normal mood and affect. Oriented x3.      ASSESSMENT/PLAN:  Laceration of foot, right, initial encounter - happened >1 weeks ago, healing by secondary intention, educated on wet-to-dry dressing and change at least twice per day. D/C peroxide use, can use antibiotic ointment and mild soap/water. Likely will scar. Possible some superficial nerve damage given tingling, or edema due to inflammation/healing. Ok to use Acetaminophen/NSAIDs for pain.    All questions were answered. Visit summary with medication list and pertinent instructions was printed for patient to review. ER/RTC precautions were reviewed with the patient. Return  for routine preventive care in 11/2015 (due for Pap), sooner if needed.

## 2016-02-09 ENCOUNTER — Ambulatory Visit (INDEPENDENT_AMBULATORY_CARE_PROVIDER_SITE_OTHER): Payer: BLUE CROSS/BLUE SHIELD | Admitting: Osteopathic Medicine

## 2016-02-09 ENCOUNTER — Encounter: Payer: Self-pay | Admitting: Osteopathic Medicine

## 2016-02-09 VITALS — BP 124/85 | HR 84 | Ht 66.0 in | Wt 197.0 lb

## 2016-02-09 DIAGNOSIS — F329 Major depressive disorder, single episode, unspecified: Secondary | ICD-10-CM | POA: Diagnosis not present

## 2016-02-09 DIAGNOSIS — M940 Chondrocostal junction syndrome [Tietze]: Secondary | ICD-10-CM

## 2016-02-09 DIAGNOSIS — Z720 Tobacco use: Secondary | ICD-10-CM

## 2016-02-09 DIAGNOSIS — F411 Generalized anxiety disorder: Secondary | ICD-10-CM | POA: Diagnosis not present

## 2016-02-09 MED ORDER — METHYLPREDNISOLONE 4 MG PO TBPK: ORAL_TABLET | ORAL | 0 refills | Status: DC

## 2016-02-09 MED ORDER — BUPROPION HCL ER (SR) 150 MG PO TB12: 150.0000 mg | ORAL_TABLET | Freq: Two times a day (BID) | ORAL | 1 refills | Status: DC

## 2016-02-09 MED ORDER — NAPROXEN 500 MG PO TABS: 500.0000 mg | ORAL_TABLET | Freq: Two times a day (BID) | ORAL | 0 refills | Status: DC

## 2016-02-09 MED ORDER — ESCITALOPRAM OXALATE 20 MG PO TABS: 20.0000 mg | ORAL_TABLET | Freq: Every day | ORAL | 1 refills | Status: DC

## 2016-02-09 NOTE — Progress Notes (Signed)
HPI: Andrea Esparza is a 26 y.o. female who [facility] [place] 02[facility]09[facility]16 for chief complaint of:  Chief Complaint  Patient presents with  . Shortness of Breath  . Cough    Acute Illness: . Context: Hx costochondritis thinks this is same. Had cold around thanksgiving That cough at that point, chest discomfort has been nagging ever since. . Location: Right upper chest . Quality: Sore/tender, worse with deep breath.  . Modifying factors: has tried the following OTC/Rx medications: none  Note, patient is switching pharmacies, would like refills on her medications for depression. Is doing well on Lexapro and Wellbutrin   Past medical, social and family history reviewed. Current medications and allergies reviewed.     Review of Systems:  Constitutional: No  fever/chills  HEENT: No  headache, No  sore throat, No  swollen glands  Cardiovascular: No chest pain  Respiratory:No  cough, no shortness of breath on exertion, soreness with taking a deep breath  Gastrointestinal: No  nausea, No  vomiting,  No  diarrhea  Skin/Integument:  No  rash   Exam:  BP 124/85   Pulse 84   Ht 5\' 6"  (1.676 m)   Wt 197 lb (89.4 kg)   BMI 31.80 kg/m   Constitutional: VSS, see above. General Appearance: alert, well-developed, well-nourished, NAD  Eyes: Normal lids and conjunctive, non-icteric sclera, PERRLA  Neck: No masses, trachea midline. normal lymph nodes  Respiratory: Normal respiratory effort. No  wheeze/rhonchi/rales  Cardiovascular: S1/S2 normal, no murmur/rub/gallop auscultated. RRR. Tenderness lateral to sternum right upper chest    ASSESSMENT/PLAN: No cough, no fever. Exam consistent with costochondritis, trial anti-inflammatories, steroids if these aren't helping. If worse, seek care ASAP. No family history of cardiac disease at young age. If no improvement next week, return for chest x-ray  Costochondritis - Plan: methylPREDNISolone (MEDROL  DOSEPAK) 4 MG TBPK tablet, naproxen (NAPROSYN) 500 MG tablet  Major depression, chronic - stable on lexapro/wellbutrin - Plan: escitalopram (LEXAPRO) 20 MG tablet  Generalized anxiety disorder - stable on lexapro/wellbutrin - Plan: escitalopram (LEXAPRO) 20 MG tablet  Tobacco abuse - wellbutrin - Plan: buPROPion (WELLBUTRIN SR) 150 MG 12 hr tablet     Patient Instructions  Chest discomfort at this point seems consistent with costochondritis rather than more concerning respiratory or cardiac issue. However, if you're not feeling at least a little bit improvement over the weekends, or if you get worse, please let us know and we will need to get chest x-ray and consider EKG. If chest pain worsens or changes, please seek medical care ASAP.    Visit summary was printed for the patient with medications and pertinent instructions for patient to review. ER/RTC precautions reviewed. All questions answered. Return if symptoms worsen or fail to improve.

## 2016-02-09 NOTE — Patient Instructions (Signed)
Chest discomfort at this point seems consistent with costochondritis rather than more concerning respiratory or cardiac issue. However, if you're not feeling at least a little bit improvement over the weekends, or if you get worse, please let us know and we will need to get chest x-ray and consider EKG. If chest pain worsens or changes, please seek medical care ASAP.

## 2016-02-13 ENCOUNTER — Telehealth: Payer: Self-pay

## 2016-02-13 DIAGNOSIS — R0789 Other chest pain: Secondary | ICD-10-CM

## 2016-02-13 NOTE — Telephone Encounter (Signed)
Patient called stated that she was seen last week for shortness of breath. Patient stated that she is any better and she is having constant heartburn. Please advise what patient needs to do. Brewer Hitchman,CMA

## 2016-02-13 NOTE — Telephone Encounter (Signed)
Patient has been informed. Rhonda Cunningham,CMA  

## 2016-02-13 NOTE — Telephone Encounter (Signed)
Would recommend that she come downstairs for a chest x-ray today if possible, I'll look this over and call her once I have the radiologists results unless something obvious shows up on the Xray. In the meantime, would try over-the-counter treatment with Tums or Pepto-Bismol if she hasn't already tried this.   If x-ray is normal and over-the-counter medications are not helpful, would recommend that she be seen back in the office for further evaluation.   (Can double-book me today or she can use one of the openings tomorrow)

## 2016-02-14 ENCOUNTER — Encounter: Payer: Self-pay | Admitting: Osteopathic Medicine

## 2016-02-14 ENCOUNTER — Encounter (HOSPITAL_COMMUNITY): Payer: Self-pay

## 2016-02-14 ENCOUNTER — Ambulatory Visit (INDEPENDENT_AMBULATORY_CARE_PROVIDER_SITE_OTHER): Payer: BLUE CROSS/BLUE SHIELD

## 2016-02-14 ENCOUNTER — Emergency Department (HOSPITAL_COMMUNITY): Payer: BLUE CROSS/BLUE SHIELD

## 2016-02-14 ENCOUNTER — Emergency Department (HOSPITAL_COMMUNITY)
Admission: EM | Admit: 2016-02-14 | Discharge: 2016-02-15 | Disposition: A | Discharge status: Home / Self Care | Payer: BLUE CROSS/BLUE SHIELD | Attending: Emergency Medicine | Admitting: Emergency Medicine

## 2016-02-14 ENCOUNTER — Ambulatory Visit (INDEPENDENT_AMBULATORY_CARE_PROVIDER_SITE_OTHER): Payer: BLUE CROSS/BLUE SHIELD | Admitting: Osteopathic Medicine

## 2016-02-14 VITALS — BP 139/77 | HR 71 | Ht 66.0 in | Wt 196.0 lb

## 2016-02-14 DIAGNOSIS — F17200 Nicotine dependence, unspecified, uncomplicated: Secondary | ICD-10-CM | POA: Diagnosis not present

## 2016-02-14 DIAGNOSIS — R12 Heartburn: Secondary | ICD-10-CM

## 2016-02-14 DIAGNOSIS — R0602 Shortness of breath: Secondary | ICD-10-CM

## 2016-02-14 DIAGNOSIS — R072 Precordial pain: Secondary | ICD-10-CM | POA: Diagnosis present

## 2016-02-14 DIAGNOSIS — J40 Bronchitis, not specified as acute or chronic: Secondary | ICD-10-CM | POA: Insufficient documentation

## 2016-02-14 DIAGNOSIS — R0789 Other chest pain: Secondary | ICD-10-CM | POA: Diagnosis not present

## 2016-02-14 DIAGNOSIS — Z79899 Other long term (current) drug therapy: Secondary | ICD-10-CM | POA: Diagnosis not present

## 2016-02-14 DIAGNOSIS — Z5181 Encounter for therapeutic drug level monitoring: Secondary | ICD-10-CM | POA: Insufficient documentation

## 2016-02-14 LAB — BASIC METABOLIC PANEL
Anion gap: 9 (ref 5–15)
BUN: 12 mg/dL (ref 6–20)
CALCIUM: 9.5 mg/dL (ref 8.9–10.3)
CO2: 23 mmol/L (ref 22–32)
CREATININE: 0.85 mg/dL (ref 0.44–1.00)
Chloride: 103 mmol/L (ref 101–111)
GFR calc Af Amer: >60 mL/min (ref >60)
GLUCOSE: 94 mg/dL (ref 65–99)
Potassium: 3.7 mmol/L (ref 3.5–5.1)
Sodium: 135 mmol/L (ref 135–145)

## 2016-02-14 LAB — PROTIME-INR
INR: 0.98
Prothrombin Time: 13 seconds (ref 11.4–15.2)

## 2016-02-14 LAB — I-STAT TROPONIN, ED: Troponin i, poc: 0 ng/mL (ref 0.00–0.08)

## 2016-02-14 LAB — CBC
HCT: 45.1 % (ref 36.0–46.0)
Hemoglobin: 15.7 g/dL — ABNORMAL HIGH (ref 12.0–15.0)
MCH: 33.2 pg (ref 26.0–34.0)
MCHC: 34.8 g/dL (ref 30.0–36.0)
MCV: 95.3 fL (ref 78.0–100.0)
PLATELETS: 235 10*3/uL (ref 150–400)
RBC: 4.73 MIL/uL (ref 3.87–5.11)
RDW: 12.3 % (ref 11.5–15.5)
WBC: 12 10*3/uL — AB (ref 4.0–10.5)

## 2016-02-14 LAB — I-STAT BETA HCG BLOOD, ED (MC, WL, AP ONLY): I-stat hCG, quantitative: <5 m[IU]/mL (ref <5)

## 2016-02-14 LAB — APTT: aPTT: 30 seconds (ref 24–36)

## 2016-02-14 LAB — D-DIMER, QUANTITATIVE (NOT AT ARMC): D DIMER QUANT: 0.57 ug{FEU}/mL — AB (ref <0.50)

## 2016-02-14 MED ORDER — IOPAMIDOL (ISOVUE-370) INJECTION 76%
INTRAVENOUS | Status: AC
  Administered 2016-02-14: 100 mL via INTRAVENOUS
  Filled 2016-02-14: qty 100

## 2016-02-14 MED ORDER — ALBUTEROL SULFATE HFA 108 (90 BASE) MCG/ACT IN AERS
1.0000 | INHALATION_SPRAY | RESPIRATORY_TRACT | Status: DC | PRN
  Administered 2016-02-15: 1 via RESPIRATORY_TRACT
  Filled 2016-02-14: qty 6.7

## 2016-02-14 NOTE — ED Provider Notes (Signed)
[facility]-EMERGENCY DEPT Provider Note   CSN: [id number] Arrival date & time: 02/14/16  2130     History   Chief Complaint Chief Complaint  Patient presents with  . positive d-dimer  . Chest Pain    HPI Andrea Esparza is a 26 y.o. female.  Patient is a 26 year old female with history of tobacco abuse presenting today with a one-month history of persistent chest pain. In November she had URI symptoms which lingered with cough, intermittent shortness of breath and occasional wheezing. She started to feel better but saw her doctor a few weeks ago. At that time she was given prednisone and NSAIDs. She took it for 3 days but then started to get severe heartburn and stopped. She states the pain is worse with deep breathing or if she sits up a long period of time and stretches. She occasionally has shortness of breath with exertion but denies any productive cough or hemoptysis. Patient was seen by PCP today and had lab work done which showed an elevated d-dimer and she was sent here to get a CAT scan   The history is provided by the patient.  Chest Pain   This is a chronic problem. Episode onset: 1 month  The problem occurs constantly. The problem has not changed since onset.The pain is associated with breathing and movement. The pain is present in the substernal region. The pain is at a severity of 4/10. The pain is moderate. The quality of the pain is described as pleuritic. The pain does not radiate. The symptoms are aggravated by certain positions and deep breathing. Associated symptoms include cough and shortness of breath. Pertinent negatives include no nausea, no syncope and no vomiting. Treatments tried: nsaids, prednisone which caused bad heartburn. The treatment provided moderate relief. Risk factors include smoking/tobacco exposure.  Pertinent negatives for past medical history include no CAD and no hypertension.    Past Medical History:  Diagnosis Date  . Alcohol abuse 01/04/2015   Reports 12 drinks per week and self/others concerned about this use   . Generalized anxiety disorder 04/27/2015  . IUD (intrauterine device) in place 01/04/2015  . Major depression, chronic 04/27/2015    Patient Active Problem List   Diagnosis Date Noted  . Laceration of foot 07/12/2015  . Major depression, chronic 04/27/2015  . Generalized anxiety disorder 04/27/2015  . Tobacco abuse 04/27/2015  . Tobacco withdrawal 04/27/2015  . Cough 04/27/2015  . Costochondritis 04/27/2015  . Alcohol abuse 01/04/2015  . IUD (intrauterine device) in place 01/04/2015    Past Surgical History:  Procedure Laterality Date  . MOUTH SURGERY  2004/2005    OB History    No data available       Home Medications    Prior to Admission medications   Medication Sig Start Date End Date Taking? Authorizing Provider  buPROPion (WELLBUTRIN SR) 150 MG 12 hr tablet Take 1 tablet (150 mg total) by mouth 2 (two) times daily. 02/09/16   Sunnie Nielsen, DO  escitalopram (LEXAPRO) 20 MG tablet Take 1 tablet (20 mg total) by mouth daily. 02/09/16   Sunnie Nielsen, DO  fluticasone (FLOVENT HFA) 110 MCG/ACT inhaler Inhale 2 puffs into the lungs 2 (two) times daily. For cough 04/27/15   Sunnie Nielsen, DO  levonorgestrel (MIRENA) 20 MCG/24HR IUD 1 each by Intrauterine route once.    Historical Provider, MD    Family History Family History  Problem Relation Age of Onset  . Depression Father   . Alcohol abuse Maternal Aunt   .  Alcohol abuse Maternal Uncle   . Cancer Maternal Grandfather   . Diabetes Paternal Grandmother   . Alcohol abuse Paternal Grandfather   . Heart attack Paternal Grandfather     Social History Social History  Substance Use Topics  . Smoking status: Current Every Day Smoker  . Smokeless tobacco: Never Used  . Alcohol use Yes     Allergies   Penicillins   Review of Systems Review of Systems  Respiratory: Positive for cough and shortness of breath.   Cardiovascular:  Positive for chest pain. Negative for syncope.  Gastrointestinal: Negative for nausea and vomiting.  All other systems reviewed and are negative.    Physical Exam Updated Vital Signs BP 140/81   Pulse 88   Temp 98.4 F (36.9 C) (Oral)   Resp 18   Ht 5\' 6"  (1.676 m)   Wt 195 lb (88.5 kg)   SpO2 100%   BMI 31.47 kg/m   Physical Exam  Constitutional: She is oriented to person, place, and time. She appears well-developed and well-nourished. No distress.  HENT:  Head: Normocephalic and atraumatic.  Mouth/Throat: Oropharynx is clear and moist.  Eyes: Conjunctivae and EOM are normal. Pupils are equal, round, and reactive to light.  Neck: Normal range of motion. Neck supple.  Cardiovascular: Normal rate, regular rhythm and intact distal pulses.   No murmur heard. Pulmonary/Chest: Effort normal and breath sounds normal. No respiratory distress. She has no wheezes. She has no rales. She exhibits tenderness.  Abdominal: Soft. She exhibits no distension. There is no tenderness. There is no rebound and no guarding.  Musculoskeletal: Normal range of motion. She exhibits no edema or tenderness.  Neurological: She is alert and oriented to person, place, and time.  Skin: Skin is warm and dry. No rash noted. No erythema.  Psychiatric: She has a normal mood and affect. Her behavior is normal.  Nursing note and vitals reviewed.    ED Treatments / Results  Labs (all labs ordered are listed, but only abnormal results are displayed) Labs Reviewed  CBC - Abnormal; Notable for the following:       Result Value   WBC 12.0 (*)    Hemoglobin 15.7 (*)    All other components within normal limits  BASIC METABOLIC PANEL  APTT  PROTIME-INR  I-STAT BETA HCG BLOOD, ED ([facility], [facility], [facility] ONLY)  I-STAT TROPOININ, ED    EKG  EKG Interpretation  Date/Time:  Wednesday February 14 2016 21:45:16 EST Ventricular Rate:  90 PR Interval:  144 QRS Duration: 86 QT Interval:  316 QTC Calculation: 386 R  Axis:   61 Text Interpretation:  Normal sinus rhythm Nonspecific ST abnormality Baseline wander No previous tracing Confirmed by Anitra LauthPLUNKETT  MD, Alphonzo LemmingsWHITNEY ([id number]) on 02/14/2016 11:33:03 PM       Radiology Dg Chest 2 View  Result Date: 02/14/2016 CLINICAL DATA:  Chest discomfort for 1 month. EXAM: CHEST  2 VIEW COMPARISON:  04/27/2015. FINDINGS: Trachea is midline. Heart size normal. Lungs are clear. No pleural fluid. IMPRESSION: No acute findings. Electronically Signed   By: Leanna BattlesMelinda  Blietz M.D.   On: 02/14/2016 16:09   Ct Angio Chest Pe W And/or Wo Contrast  Result Date: 02/14/2016 CLINICAL DATA:  Chest pain and shortness of breath EXAM: CT ANGIOGRAPHY CHEST WITH CONTRAST TECHNIQUE: Multidetector CT imaging of the chest was performed using the standard protocol during bolus administration of intravenous contrast. Multiplanar CT image reconstructions and MIPs were obtained to evaluate the vascular anatomy. CONTRAST:  100 mL  Isovue 370 IV COMPARISON:  Chest radiograph 02/14/2016 FINDINGS: Cardiovascular: Contrast injection is sufficient to demonstrate satisfactory opacification of the pulmonary arteries to the segmental level. There is no pulmonary embolus. The main pulmonary artery is within normal limits for size. There is no CT evidence of acute right heart strain. The visualized aorta is normal. There is a normal 3-vessel arch branching pattern. Heart size is normal, without pericardial effusion. Mediastinum/Nodes: No mediastinal, hilar or axillary lymphadenopathy. The visualized thyroid and thoracic esophageal course are unremarkable. Lungs/Pleura: No pulmonary nodules or masses. No pleural effusion or pneumothorax. No focal airspace consolidation. No focal pleural abnormality. Upper Abdomen: Contrast bolus timing is not optimized for evaluation of the abdominal organs. Within this limitation, the visualized organs of the upper abdomen are normal. Musculoskeletal: No chest wall abnormality. No acute or  significant osseous findings. Review of the MIP images confirms the above findings. IMPRESSION: No pulmonary embolus.  Normal CTA of the chest. Electronically Signed   By: Deatra Robinson M.D.   On: 02/14/2016 23:15    Procedures Procedures (including critical care time)  Medications Ordered in ED Medications  albuterol (PROVENTIL HFA;VENTOLIN HFA) 108 (90 Base) MCG/ACT inhaler 1 puff (not administered)  iopamidol (ISOVUE-370) 76 % injection (100 mLs Intravenous Contrast Given 02/14/16 2300)     Initial Impression / Assessment and Plan / ED Course  I have reviewed the triage vital signs and the nursing notes.  Pertinent labs & imaging results that were available during my care of the patient were reviewed by me and considered in my medical decision making (see chart for details).  Clinical Course    Patient presenting today with atypical chest pain and mostly chest wall pain. Patient is a smoker and has had symptoms of bronchitis for months. Patient had a positive d-dimer in the office and was sent here for a CT of the chest. Patient's vital signs are within normal limits. CTA was negative for PE. Labs with minimal leukocytosis and EKG with nonspecific ST changes however patient has a roaming baseline and was not a good tracing. Troponin was negative. Patient was encouraged to use Tylenol when necessary she was also given Bentyl inhaler and encouraged to stop smoking.  Final Clinical Impressions(s) / ED Diagnoses   Final diagnoses:  Chest wall pain  Bronchitis    New Prescriptions New Prescriptions   No medications on file     Gwyneth Sprout, MD 02/15/16 [phone]

## 2016-02-14 NOTE — ED Triage Notes (Signed)
Pt states that for the past 3-4 weeks she has been having central CP with some SOB. Seen PCP and was thought to have costochondritis. Today had a d-dimer and it came back positive and was sent here for CT angio to rule out PE.

## 2016-02-14 NOTE — Progress Notes (Signed)
HPI: Andrea Esparza is a 26 y.o. female  who presents to [place] today, 02/14/16,  for chief complaint of:  Chief Complaint  Patient presents with  . Allergic Reaction    CONSTANT HEARTBURN FROM MEDICATION     Recently started on steroids and anti-inflammatories for suspected costochondritis/possible bronchitis. Patient is still complaining of some shortness of breath particularly on exertion, chest tightness across the upper part of her chest, no palpitations, no dizziness. Patient stated she has stopped the steroids and anti-inflammatories due to significant heartburn, the heartburn is doing at her overall but respiratory symptoms are persistent.  Past medical, surgical, social and family history reviewed: Patient Active Problem List   Diagnosis Date Noted  . Laceration of foot 07/12/2015  . Major depression, chronic 04/27/2015  . Generalized anxiety disorder 04/27/2015  . Tobacco abuse 04/27/2015  . Tobacco withdrawal 04/27/2015  . Cough 04/27/2015  . Costochondritis 04/27/2015  . Alcohol abuse 01/04/2015  . IUD (intrauterine device) in place 01/04/2015   Past Surgical History:  Procedure Laterality Date  . MOUTH SURGERY  2004/2005   Social History  Substance Use Topics  . Smoking status: Current Every Day Smoker  . Smokeless tobacco: Not on file  . Alcohol use Yes   Family History  Problem Relation Age of Onset  . Depression Father   . Alcohol abuse Maternal Aunt   . Alcohol abuse Maternal Uncle   . Cancer Maternal Grandfather   . Diabetes Paternal Grandmother   . Alcohol abuse Paternal Grandfather   . Heart attack Paternal Grandfather      Current medication list and allergy/intolerance information reviewed:   Current Outpatient Prescriptions on File Prior to Visit  Medication Sig Dispense Refill  . buPROPion (WELLBUTRIN SR) 150 MG 12 hr tablet Take 1 tablet (150 mg total) by mouth 2 (two) times daily. 180 tablet 1  .  escitalopram (LEXAPRO) 20 MG tablet Take 1 tablet (20 mg total) by mouth daily. 90 tablet 1  . fluticasone (FLOVENT HFA) 110 MCG/ACT inhaler Inhale 2 puffs into the lungs 2 (two) times daily. For cough 1 Inhaler 0  . levonorgestrel (MIRENA) 20 MCG/24HR IUD 1 each by Intrauterine route once.     No current facility-administered medications on file prior to visit.    Allergies  Allergen Reactions  . Penicillins Other (See Comments)      Review of Systems:  Constitutional: +recent illness  Cardiac: No  chest pain, +pressure, No palpitations  Respiratory:  +shortness of breath. No  Cough  Gastrointestinal: No  abdominal pain, no change on bowel habits  Musculoskeletal: No new myalgia/arthralgia  Skin: No  Rash  Neurologic: No  weakness, No  Dizziness   Exam:  BP 139/77   Pulse 71   Ht 5' 6"  (1.676 m)   Wt 196 lb (88.9 kg)   BMI 31.64 kg/m   Constitutional: VS see above. General Appearance: alert, well-developed, well-nourished, NAD  Eyes: Normal lids and conjunctive, non-icteric sclera  Ears, Nose, Mouth, Throat: MMM, Normal external inspection ears/nares/mouth/lips/gums.  Neck: No masses, trachea midline.   Respiratory: Normal respiratory effort. no wheeze, no rhonchi, no rales  Cardiovascular: S1/S2 normal, no murmur, no rub/gallop auscultated. RRR.   Musculoskeletal: Gait normal. Symmetric and independent movement of all extremities  Neurological: Normal balance/coordination. No tremor.  Skin: warm, dry, intact.   Psychiatric: Normal judgment/insight. Normal mood and affect. Oriented x3.      ASSESSMENT/PLAN:   Shortness of breath - Plan: CBC with  Differential/Platelet, COMPLETE METABOLIC PANEL WITH GFR, TSH, D-dimer, quantitative (not at [facility])  Heartburn    Results for orders placed or performed in visit on 02/14/16 (from the past 72 hour(s))  CBC with Differential/Platelet     Status: None   Collection Time: 02/14/16  2:55 PM  Result Value Ref  Range   WBC 9.6 3.8 - 10.8 K/uL   RBC 4.61 3.80 - 5.10 MIL/uL   Hemoglobin 15.0 11.7 - 15.5 g/dL   HCT 44.4 35.0 - 45.0 %   MCV 96.3 80.0 - 100.0 fL   MCH 32.5 27.0 - 33.0 pg   MCHC 33.8 32.0 - 36.0 g/dL   RDW 12.8 11.0 - 15.0 %   Platelets 248 140 - 400 K/uL   MPV 9.7 7.5 - 12.5 fL   Neutro Abs 5,856 1,500 - 7,800 cells/uL   Lymphs Abs 2,880 850 - 3,900 cells/uL   Monocytes Absolute 768 200 - 950 cells/uL   Eosinophils Absolute 96 15 - 500 cells/uL   Basophils Absolute 0 0 - 200 cells/uL   Neutrophils Relative % 61 %   Lymphocytes Relative 30 %   Monocytes Relative 8 %   Eosinophils Relative 1 %   Basophils Relative 0 %   Smear Review Criteria for review not met   COMPLETE METABOLIC PANEL WITH GFR     Status: None   Collection Time: 02/14/16  2:55 PM  Result Value Ref Range   Sodium 137 135 - 146 mmol/L   Potassium 4.2 3.5 - 5.3 mmol/L   Chloride 103 98 - 110 mmol/L   CO2 26 20 - 31 mmol/L   Glucose, Bld 67 65 - 99 mg/dL   BUN 10 7 - 25 mg/dL   Creat 0.80 0.50 - 1.10 mg/dL   Total Bilirubin 0.4 0.2 - 1.2 mg/dL   Alkaline Phosphatase 62 33 - 115 U/L   AST 16 10 - 30 U/L   ALT 12 6 - 29 U/L   Total Protein 6.9 6.1 - 8.1 g/dL   Albumin 4.4 3.6 - 5.1 g/dL   Calcium 9.0 8.6 - 10.2 mg/dL   GFR, Est African American >89 >=60 mL/min   GFR, Est Non African American >89 >=60 mL/min  TSH     Status: None   Collection Time: 02/14/16  2:55 PM  Result Value Ref Range   TSH 1.73 mIU/L    Comment:   Reference Range   > or = 20 Years  0.40-4.50   Pregnancy Range First trimester  0.26-2.66 Second trimester 0.55-2.73 Third trimester  0.43-2.91     D-dimer, quantitative (not at [facility])     Status: Abnormal   Collection Time: 02/14/16  2:55 PM  Result Value Ref Range   D-Dimer, Quant 0.57 (H) <0.50 mcg/mL FEU    Comment:   The D-Dimer test is used frequently to exclude an acute PE or DVT.  In patients with a low to moderate clinical risk assessment and a D-Dimer result <0.50  mcg/mL FEU, the likelihood of a PE or DVT is very low.  However, a thromboembolic event should not be excluded solely on the basis of the D-Dimer level.  Increased levels of D-Dimer are associated with a PE, DVT, DIC, malignancies, inflammation, sepsis, surgery, trauma, pregnancy, and advancing patient age. [Jama 2006 11:295(2): 947-096]   For additional information, please refer to: http://education.[employer].com/faq/FAQ149 (This link is being provided for information/ educational purposes only)       Dg Chest 2 View  Result Date:  02/14/2016 CLINICAL DATA:  Chest discomfort for 1 month. EXAM: CHEST  2 VIEW COMPARISON:  04/27/2015. FINDINGS: Trachea is midline. Heart size normal. Lungs are clear. No pleural fluid. IMPRESSION: No acute findings. Electronically Signed   By: Lorin Picket M.D.   On: 02/14/2016 16:09   Ct Angio Chest Pe W And/or Wo Contrast  Result Date: 02/14/2016 CLINICAL DATA:  Chest pain and shortness of breath EXAM: CT ANGIOGRAPHY CHEST WITH CONTRAST TECHNIQUE: Multidetector CT imaging of the chest was performed using the standard protocol during bolus administration of intravenous contrast. Multiplanar CT image reconstructions and MIPs were obtained to evaluate the vascular anatomy. CONTRAST:  100 mL Isovue 370 IV COMPARISON:  Chest radiograph 02/14/2016 FINDINGS: Cardiovascular: Contrast injection is sufficient to demonstrate satisfactory opacification of the pulmonary arteries to the segmental level. There is no pulmonary embolus. The main pulmonary artery is within normal limits for size. There is no CT evidence of acute right heart strain. The visualized aorta is normal. There is a normal 3-vessel arch branching pattern. Heart size is normal, without pericardial effusion. Mediastinum/Nodes: No mediastinal, hilar or axillary lymphadenopathy. The visualized thyroid and thoracic esophageal course are unremarkable. Lungs/Pleura: No pulmonary nodules or masses. No  pleural effusion or pneumothorax. No focal airspace consolidation. No focal pleural abnormality. Upper Abdomen: Contrast bolus timing is not optimized for evaluation of the abdominal organs. Within this limitation, the visualized organs of the upper abdomen are normal. Musculoskeletal: No chest wall abnormality. No acute or significant osseous findings. Review of the MIP images confirms the above findings. IMPRESSION: No pulmonary embolus.  Normal CTA of the chest. Electronically Signed   By: Ulyses Jarred M.D.   On: 02/14/2016 23:15    D-dimer came back slightly positive but CT is negative for PE, patient states that ER provided her with some inhalers to try, spoke to her over the phone, see telephone note/lab results. Further or if persistent illness please return to clinic. Need to consider pulmonary function test, patient declines EKG in the office  Visit summary with medication list and pertinent instructions was printed for patient to review. All questions at time of visit were answered - patient instructed to contact office with any additional concerns. ER/RTC precautions were reviewed with the patient. Follow-up plan: Return if symptoms worsen or fail to improve.

## 2016-02-15 LAB — COMPLETE METABOLIC PANEL WITH GFR
ALBUMIN: 4.4 g/dL (ref 3.6–5.1)
ALK PHOS: 62 U/L (ref 33–115)
ALT: 12 U/L (ref 6–29)
AST: 16 U/L (ref 10–30)
BILIRUBIN TOTAL: 0.4 mg/dL (ref 0.2–1.2)
BUN: 10 mg/dL (ref 7–25)
CALCIUM: 9 mg/dL (ref 8.6–10.2)
CO2: 26 mmol/L (ref 20–31)
Chloride: 103 mmol/L (ref 98–110)
Creat: 0.8 mg/dL (ref 0.50–1.10)
GFR, Est African American: >89 mL/min (ref >=60)
Glucose, Bld: 67 mg/dL (ref 65–99)
POTASSIUM: 4.2 mmol/L (ref 3.5–5.3)
SODIUM: 137 mmol/L (ref 135–146)
TOTAL PROTEIN: 6.9 g/dL (ref 6.1–8.1)

## 2016-02-15 LAB — CBC WITH DIFFERENTIAL/PLATELET
BASOS ABS: 0 {cells}/uL (ref 0–200)
Basophils Relative: 0 %
EOS ABS: 96 {cells}/uL (ref 15–500)
EOS PCT: 1 %
HCT: 44.4 % (ref 35.0–45.0)
HEMOGLOBIN: 15 g/dL (ref 11.7–15.5)
LYMPHS ABS: 2880 {cells}/uL (ref 850–3900)
Lymphocytes Relative: 30 %
MCH: 32.5 pg (ref 27.0–33.0)
MCHC: 33.8 g/dL (ref 32.0–36.0)
MCV: 96.3 fL (ref 80.0–100.0)
MPV: 9.7 fL (ref 7.5–12.5)
Monocytes Absolute: 768 cells/uL (ref 200–950)
Monocytes Relative: 8 %
NEUTROS ABS: 5856 {cells}/uL (ref 1500–7800)
NEUTROS PCT: 61 %
Platelets: 248 10*3/uL (ref 140–400)
RBC: 4.61 MIL/uL (ref 3.80–5.10)
RDW: 12.8 % (ref 11.0–15.0)
WBC: 9.6 10*3/uL (ref 3.8–10.8)

## 2016-02-15 LAB — TSH: TSH: 1.73 m[IU]/L

## 2016-09-15 ENCOUNTER — Other Ambulatory Visit: Payer: Self-pay | Admitting: Osteopathic Medicine

## 2016-09-15 DIAGNOSIS — F329 Major depressive disorder, single episode, unspecified: Secondary | ICD-10-CM

## 2016-09-15 DIAGNOSIS — F411 Generalized anxiety disorder: Secondary | ICD-10-CM

## 2016-10-25 ENCOUNTER — Other Ambulatory Visit: Payer: Self-pay | Admitting: Osteopathic Medicine

## 2016-10-25 DIAGNOSIS — F411 Generalized anxiety disorder: Secondary | ICD-10-CM

## 2016-10-25 DIAGNOSIS — F329 Major depressive disorder, single episode, unspecified: Secondary | ICD-10-CM

## 2017-02-06 ENCOUNTER — Encounter: Payer: Self-pay | Admitting: Emergency Medicine

## 2017-02-06 ENCOUNTER — Emergency Department
Admission: EM | Admit: 2017-02-06 | Discharge: 2017-02-06 | Disposition: A | Discharge status: Home / Self Care | Payer: BLUE CROSS/BLUE SHIELD | Source: Home / Self Care | Attending: Family Medicine | Admitting: Family Medicine

## 2017-02-06 ENCOUNTER — Other Ambulatory Visit: Payer: Self-pay

## 2017-02-06 DIAGNOSIS — R69 Illness, unspecified: Secondary | ICD-10-CM

## 2017-02-06 DIAGNOSIS — J111 Influenza due to unidentified influenza virus with other respiratory manifestations: Secondary | ICD-10-CM

## 2017-02-06 LAB — POCT INFLUENZA A/B
Influenza A, POC: NEGATIVE
Influenza B, POC: NEGATIVE

## 2017-02-06 MED ORDER — ONDANSETRON HCL 4 MG PO TABS: 4.0000 mg | ORAL_TABLET | Freq: Four times a day (QID) | ORAL | 0 refills | Status: DC

## 2017-02-06 NOTE — ED Provider Notes (Signed)
[facility]    CSN: [id number] Arrival date & time: 02/06/17  1136     History   Chief Complaint Chief Complaint  Patient presents with  . Cough  . Chills  . Generalized Body Aches  . Nasal Congestion    HPI Andrea Esparza is a 27 y.o. female.   HPI  Andrea Esparza is a 27 y.o. female presenting to UC with c/o sudden onset body aches, chills, temp of 99.9*F, sore throat, cough and mild congestion.  She took acetaminophen 1g at 11AM. Her work recommended she come be evaluated.  Denies n/v/d. No known sick contacts.   Past Medical History:  Diagnosis Date  . Alcohol abuse 01/04/2015   Reports 12 drinks per week and self/others concerned about this use   . Generalized anxiety disorder 04/27/2015  . IUD (intrauterine device) in place 01/04/2015  . Major depression, chronic 04/27/2015    Patient Active Problem List   Diagnosis Date Noted  . Laceration of foot 07/12/2015  . Major depression, chronic 04/27/2015  . Generalized anxiety disorder 04/27/2015  . Tobacco abuse 04/27/2015  . Tobacco withdrawal 04/27/2015  . Cough 04/27/2015  . Costochondritis 04/27/2015  . Alcohol abuse 01/04/2015  . IUD (intrauterine device) in place 01/04/2015    Past Surgical History:  Procedure Laterality Date  . MOUTH SURGERY  2004/2005    OB History    No data available       Home Medications    Prior to Admission medications   Medication Sig Start Date End Date Taking? Authorizing Provider  buPROPion (WELLBUTRIN SR) 150 MG 12 hr tablet Take 1 tablet (150 mg total) by mouth 2 (two) times daily. 02/09/16   Sunnie Nielsen, DO  escitalopram (LEXAPRO) 20 MG tablet Take 1 tablet (20 mg total) by mouth daily. APPOINTMENT NEEDED FOR FURTHER REFILLS 09/16/16   Tandy Gaw L, PA-C  escitalopram (LEXAPRO) 20 MG tablet TAKE 1 TABLET EVERY DAY 10/25/16   Sunnie Nielsen, DO  fluticasone (FLOVENT HFA) 110 MCG/ACT inhaler Inhale 2 puffs into the lungs 2 (two) times daily. For  cough 04/27/15   Sunnie Nielsen, DO  levonorgestrel (MIRENA) 20 MCG/24HR IUD 1 each by Intrauterine route once.    [provider]  ondansetron (ZOFRAN) 4 MG tablet Take 1 tablet (4 mg total) by mouth every 6 (six) hours. 02/06/17   Lurene Shadow, PA-C    Family History Family History  Problem Relation Age of Onset  . Depression Father   . Alcohol abuse Maternal Aunt   . Alcohol abuse Maternal Uncle   . Cancer Maternal Grandfather   . Diabetes Paternal Grandmother   . Alcohol abuse Paternal Grandfather   . Heart attack Paternal Grandfather     Social History Social History   Tobacco Use  . Smoking status: Current Every Day Smoker  . Smokeless tobacco: Never Used  Substance Use Topics  . Alcohol use: Yes  . Drug use: Not on file     Allergies   Penicillins   Review of Systems Review of Systems  Constitutional: Positive for chills, fatigue and fever.  HENT: Positive for congestion, rhinorrhea and sore throat. Negative for ear pain, trouble swallowing and voice change.   Respiratory: Positive for cough. Negative for shortness of breath.   Cardiovascular: Negative for chest pain and palpitations.  Gastrointestinal: Positive for nausea. Negative for abdominal pain, diarrhea and vomiting.  Musculoskeletal: Positive for arthralgias, back pain and myalgias.  Skin: Negative for rash.  Neurological: Positive for headaches.  Negative for dizziness and light-headedness.     Physical Exam Triage Vital Signs ED Triage Vitals  Enc Vitals Group     BP 02/06/17 1235 118/79     Pulse Rate 02/06/17 1235 (!) 107     Resp 02/06/17 1235 16     Temp 02/06/17 1235 99.7 F (37.6 C)     Temp Source 02/06/17 1235 Oral     SpO2 02/06/17 1235 98 %     Weight 02/06/17 1236 210 lb (95.3 kg)     Height 02/06/17 1236 5\' 6"  (1.676 m)     Head Circumference --      Peak Flow --      Pain Score 02/06/17 1236 3     Pain Loc --      Pain Edu? --      Excl. in GC? --    No data  found.  Updated Vital Signs BP 118/79 (BP Location: Right Arm)   Pulse (!) 107   Temp 99.7 F (37.6 C) (Oral)   Resp 16   Ht 5\' 6"  (1.676 m)   Wt 210 lb (95.3 kg)   SpO2 98%   BMI 33.89 kg/m   Visual Acuity Right Eye Distance:   Left Eye Distance:   Bilateral Distance:    Right Eye Near:   Left Eye Near:    Bilateral Near:     Physical Exam  Constitutional: She is oriented to person, place, and time. She appears well-developed and well-nourished. No distress.  HENT:  Head: Normocephalic and atraumatic.  Right Ear: Tympanic membrane normal.  Left Ear: Tympanic membrane normal.  Nose: Nose normal.  Mouth/Throat: Uvula is midline, oropharynx is clear and moist and mucous membranes are normal.  Eyes: EOM are normal.  Neck: Normal range of motion. Neck supple.  Cardiovascular: Normal rate and regular rhythm.  Pulmonary/Chest: Effort normal and breath sounds normal. No stridor. No respiratory distress. She has no wheezes. She has no rales.  Musculoskeletal: Normal range of motion.  Neurological: She is alert and oriented to person, place, and time.  Skin: Skin is warm and dry. She is not diaphoretic.  Psychiatric: She has a normal mood and affect. Her behavior is normal.  Nursing note and vitals reviewed.    UC Treatments / Results  Labs (all labs ordered are listed, but only abnormal results are displayed) Labs Reviewed  POCT INFLUENZA A/B    EKG  EKG Interpretation None       Radiology No results found.  Procedures Procedures (including critical care time)  Medications Ordered in UC Medications - No data to display   Initial Impression / Assessment and Plan / UC Course  I have reviewed the triage vital signs and the nursing notes.  Pertinent labs & imaging results that were available during my care of the patient were reviewed by me and considered in my medical decision making (see chart for details).     Hx and exam c/w influenza.  Flu test  performed in triage, Negative Discussed possibility of false negatives and Tamiflu tx.  Pt declined Tamiflu Will tx symptomatically Encouraged fluids, rest, acetaminophen and ibuprofen F/u with PCP in 1 week if not improving, sooner if worsening. Work note provided for today and tomorrow off.   Final Clinical Impressions(s) / UC Diagnoses   Final diagnoses:  Influenza-like illness    ED Discharge Orders        Ordered    ondansetron (ZOFRAN) 4 MG tablet  Every 6 hours  02/06/17 1259       Controlled Substance Prescriptions [place] Controlled Substance Registry consulted? Not Applicable   Rolla Plate 02/06/17 1610

## 2017-02-06 NOTE — Discharge Instructions (Signed)
°  You may take 500mg acetaminophen every 4-6 hours or in combination with ibuprofen 400-600mg every 6-8 hours as needed for pain, inflammation, and fever. ° °Be sure to drink at least eight 8oz glasses of water to stay well hydrated and get at least 8 hours of sleep at night, preferably more while sick.  ° °

## 2017-02-06 NOTE — ED Triage Notes (Signed)
Awoke this morning with chills, fever 99.9, aches, cough and sore throat. Took acetaminophen 1000mg  at 1100.

## 2017-05-24 ENCOUNTER — Other Ambulatory Visit: Payer: Self-pay | Admitting: Osteopathic Medicine

## 2017-05-24 DIAGNOSIS — F411 Generalized anxiety disorder: Secondary | ICD-10-CM

## 2017-05-24 DIAGNOSIS — F329 Major depressive disorder, single episode, unspecified: Secondary | ICD-10-CM

## 2017-05-26 ENCOUNTER — Telehealth: Payer: Self-pay

## 2017-05-26 DIAGNOSIS — F329 Major depressive disorder, single episode, unspecified: Secondary | ICD-10-CM

## 2017-05-26 DIAGNOSIS — F411 Generalized anxiety disorder: Secondary | ICD-10-CM

## 2017-05-26 NOTE — Telephone Encounter (Signed)
As per [facility] - pt paying $20 for escitalopram 20 mg. Pt is requesting a lower cost alternative - as per pharmacist - citalopram 40 mg cost is $5, fluoxetine 40 mg cost is $7, paroxetine 40 mg cost is $8, sertraline 100 mg cost is $10. Pls advise if change is appropriate. If so, pls send new rx to [facility].

## 2017-05-27 MED ORDER — CITALOPRAM HYDROBROMIDE 20 MG PO TABS: 20.0000 mg | ORAL_TABLET | Freq: Every day | ORAL | 1 refills | Status: DC

## 2017-05-27 NOTE — Telephone Encounter (Signed)
Switched to Citalopram If patient has additional concerns about medicine or experiences side effects on the new drug, she should make an appointment to discuss the alternative options - lots of medicines available

## 2017-05-27 NOTE — Telephone Encounter (Signed)
Left a detailed vm msg regarding new medication & provider's note. Call back information provided.

## 2017-05-28 ENCOUNTER — Other Ambulatory Visit: Payer: Self-pay | Admitting: Osteopathic Medicine

## 2017-05-28 DIAGNOSIS — F329 Major depressive disorder, single episode, unspecified: Secondary | ICD-10-CM

## 2017-05-28 DIAGNOSIS — F411 Generalized anxiety disorder: Secondary | ICD-10-CM

## 2017-10-09 ENCOUNTER — Encounter: Payer: Self-pay | Admitting: Osteopathic Medicine

## 2017-10-16 ENCOUNTER — Telehealth: Payer: Self-pay | Admitting: Osteopathic Medicine

## 2017-10-16 ENCOUNTER — Ambulatory Visit: Payer: BLUE CROSS/BLUE SHIELD | Admitting: Osteopathic Medicine

## 2017-10-16 NOTE — Telephone Encounter (Signed)
Patient missed her appointment at 8:30 today.  Looks like this was scheduled on my-chart and the only complaint was "general health" please call the patient and see what she actually needs to be seen for, whether this is an annual physical or problem based visit.  I have not seen her in over 1.5 years. thank you!

## 2017-10-20 NOTE — Telephone Encounter (Signed)
Called pt and left VM for call back with detail of what was needed

## 2017-11-25 ENCOUNTER — Other Ambulatory Visit: Payer: Self-pay

## 2017-11-25 ENCOUNTER — Emergency Department (INDEPENDENT_AMBULATORY_CARE_PROVIDER_SITE_OTHER): Payer: BLUE CROSS/BLUE SHIELD

## 2017-11-25 ENCOUNTER — Emergency Department (INDEPENDENT_AMBULATORY_CARE_PROVIDER_SITE_OTHER)
Admission: EM | Admit: 2017-11-25 | Discharge: 2017-11-25 | Disposition: A | Discharge status: Home / Self Care | Payer: BLUE CROSS/BLUE SHIELD | Source: Home / Self Care | Attending: Family Medicine | Admitting: Family Medicine

## 2017-11-25 ENCOUNTER — Encounter: Payer: Self-pay | Admitting: Emergency Medicine

## 2017-11-25 DIAGNOSIS — B9789 Other viral agents as the cause of diseases classified elsewhere: Secondary | ICD-10-CM | POA: Diagnosis not present

## 2017-11-25 DIAGNOSIS — J069 Acute upper respiratory infection, unspecified: Secondary | ICD-10-CM | POA: Diagnosis not present

## 2017-11-25 DIAGNOSIS — H6503 Acute serous otitis media, bilateral: Secondary | ICD-10-CM

## 2017-11-25 DIAGNOSIS — R05 Cough: Secondary | ICD-10-CM | POA: Diagnosis not present

## 2017-11-25 DIAGNOSIS — J9811 Atelectasis: Secondary | ICD-10-CM | POA: Diagnosis not present

## 2017-11-25 DIAGNOSIS — J9801 Acute bronchospasm: Secondary | ICD-10-CM

## 2017-11-25 MED ORDER — PREDNISONE 20 MG PO TABS: ORAL_TABLET | ORAL | 0 refills | Status: DC

## 2017-11-25 MED ORDER — DOXYCYCLINE HYCLATE 100 MG PO CAPS: 100.0000 mg | ORAL_CAPSULE | Freq: Two times a day (BID) | ORAL | 0 refills | Status: DC

## 2017-11-25 MED ORDER — BENZONATATE 200 MG PO CAPS: ORAL_CAPSULE | ORAL | 0 refills | Status: DC

## 2017-11-25 NOTE — Discharge Instructions (Signed)
Take plain guaifenesin (1200mg  extended release tabs such as Mucinex) twice daily, with plenty of water, for cough and congestion.  May add Pseudoephedrine (30mg , one or two every 4 to 6 hours) for sinus congestion.  Get adequate rest.   May use Afrin nasal spray (or generic oxymetazoline) each morning for about 5 days and then discontinue.  Also recommend using saline nasal spray several times daily and saline nasal irrigation (AYR is a common brand).  Use Flonase nasal spray each morning after using Afrin nasal spray and saline nasal irrigation. Try warm salt water gargles for sore throat.  Stop all antihistamines for now, and other non-prescription cough/cold preparations. May take Delsym Cough Suppressant Tessalon at bedtime for nighttime cough.

## 2017-11-25 NOTE — ED Triage Notes (Signed)
Pt reports nasal congestion, cough, sore throat with cough, chills and right ear pain that started on Sunday.  She reports a fever of 101.4 at home on Sunday.  She also reports fatigue.

## 2017-11-25 NOTE — ED Provider Notes (Signed)
[facility]    CSN: [id number] Arrival date & time: 11/25/17  1355     History   Chief Complaint Chief Complaint  Patient presents with  . URI    HPI LEXII WALSH is a 27 y.o. female.   Patient complains of five day history of typical cold-like symptoms developing over several days, including mild sore throat, sinus congestion, headache, fatigue, fever and night sweats, ?wheezing, right earache, and cough.  She continues to smoke.  The history is provided by the patient.    Past Medical History:  Diagnosis Date  . Alcohol abuse 01/04/2015   Reports 12 drinks per week and self/others concerned about this use   . Generalized anxiety disorder 04/27/2015  . IUD (intrauterine device) in place 01/04/2015  . Major depression, chronic 04/27/2015    Patient Active Problem List   Diagnosis Date Noted  . Laceration of foot 07/12/2015  . Major depression, chronic 04/27/2015  . Generalized anxiety disorder 04/27/2015  . Tobacco abuse 04/27/2015  . Tobacco withdrawal 04/27/2015  . Cough 04/27/2015  . Costochondritis 04/27/2015  . Alcohol abuse 01/04/2015  . IUD (intrauterine device) in place 01/04/2015    Past Surgical History:  Procedure Laterality Date  . MOUTH SURGERY  2004/2005    OB History   None      Home Medications    Prior to Admission medications   Medication Sig Start Date End Date Taking? Authorizing Provider  citalopram (CELEXA) 20 MG tablet Take 1 tablet (20 mg total) by mouth daily. 05/27/17  Yes Sunnie Nielsen, DO  levonorgestrel (MIRENA) 20 MCG/24HR IUD 1 each by Intrauterine route once.   Yes [provider]  benzonatate (TESSALON) 200 MG capsule Take one cap by mouth at bedtime as needed for cough.  May repeat in 4 to 6 hours 11/25/17   Lattie Haw, MD  doxycycline (VIBRAMYCIN) 100 MG capsule Take 1 capsule (100 mg total) by mouth 2 (two) times daily. Take with food. 11/25/17   Lattie Haw, MD  predniSONE  (DELTASONE) 20 MG tablet Take one tab by mouth twice daily for 4 days, then one daily. Take with food. 11/25/17   Lattie Haw, MD    Family History Family History  Problem Relation Age of Onset  . Depression Father   . Alcohol abuse Maternal Aunt   . Alcohol abuse Maternal Uncle   . Cancer Maternal Grandfather   . Diabetes Paternal Grandmother   . Alcohol abuse Paternal Grandfather   . Heart attack Paternal Grandfather     Social History Social History   Tobacco Use  . Smoking status: Current Every Day Smoker    Packs/day: 1.00    Types: Cigarettes  . Smokeless tobacco: Never Used  Substance Use Topics  . Alcohol use: Yes  . Drug use: Never     Allergies   Penicillins   Review of Systems Review of Systems + sore throat + cough No pleuritic pain ? wheezing + nasal congestion + post-nasal drainage No sinus pain/pressure No itchy/red eyes ? earache No hemoptysis No SOB + fever, + chills No nausea No vomiting No abdominal pain No diarrhea No urinary symptoms No skin rash + fatigue No myalgias No headache Used OTC meds without relief   Physical Exam Triage Vital Signs ED Triage Vitals  Enc Vitals Group     BP 11/25/17 1443 127/85     Pulse Rate 11/25/17 1443 96     Resp --  Temp 11/25/17 1443 98.5 F (36.9 C)     Temp Source 11/25/17 1443 Oral     SpO2 11/25/17 1443 96 %     Weight 11/25/17 1444 235 lb 12 oz (106.9 kg)     Height 11/25/17 1444 5\' 6"  (1.676 m)     Head Circumference --      Peak Flow --      Pain Score 11/25/17 1444 0     Pain Loc --      Pain Edu? --      Excl. in GC? --    No data found.  Updated Vital Signs BP 127/85 (BP Location: Right Arm)   Pulse 96   Temp 98.5 F (36.9 C) (Oral)   Ht 5\' 6"  (1.676 m)   Wt 106.9 kg   SpO2 96%   BMI 38.05 kg/m   Visual Acuity Right Eye Distance:   Left Eye Distance:   Bilateral Distance:    Right Eye Near:   Left Eye Near:    Bilateral Near:     Physical  Exam Nursing notes and Vital Signs reviewed. Appearance:  Patient appears stated age, and in no acute distress Eyes:  Pupils are equal, round, and reactive to light and accomodation.  Extraocular movement is intact.  Conjunctivae are not inflamed  Ears:  Canals normal.  Tympanic membranes have serous effusions bilaterally. Nose:  Mildly congested turbinates.  No sinus tenderness.   Pharynx:  Normal Neck:  Supple.  Enlarged posterior/lateral nodes are palpated bilaterally, tender to palpation on the left.   Lungs:  Few faint rales left chest. Breath sounds are equal.  Moving air well. Heart:  Regular rate and rhythm without murmurs, rubs, or gallops.  Abdomen:  Nontender without masses or hepatosplenomegaly.  Bowel sounds are present.  No CVA or flank tenderness.  Extremities:  No edema.  Skin:  No rash present.    UC Treatments / Results  Labs (all labs ordered are listed, but only abnormal results are displayed) Labs Reviewed - No data to display  EKG None  Radiology Dg Chest 2 View  Result Date: 11/25/2017 CLINICAL DATA:  Cough and fever. EXAM: CHEST - 2 VIEW COMPARISON:  CT 02/14/2016.  Chest x-ray 02/14/2016. FINDINGS: Mediastinum and hilar structures normal. Low lung volumes with mild bibasilar atelectasis. No focal infiltrate. No pleural effusion or pneumothorax. Heart size normal. No acute bony abnormality. IMPRESSION: Low lung volumes with mild bibasilar atelectasis. Electronically Signed   By: Maisie Fus  Register   On: 11/25/2017 15:56    Procedures Procedures (including critical care time)  Medications Ordered in UC Medications - No data to display  Initial Impression / Assessment and Plan / UC Course  I have reviewed the triage vital signs and the nursing notes.  Pertinent labs & imaging results that were available during my care of the patient were reviewed by me and considered in my medical decision making (see chart for details).    Begin doxycycline, and  prednisone burst/taper. Prescription written for Benzonatate [facility]) to take at bedtime for night-time cough.  Followup with Family Doctor if not improved in 7 to 10 days.   Final Clinical Impressions(s) / UC Diagnoses   Final diagnoses:  Viral URI with cough  Bronchospasm, acute  Non-recurrent acute serous otitis media of both ears     Discharge Instructions     Take plain guaifenesin (1200mg  extended release tabs such as Mucinex) twice daily, with plenty of water, for cough and congestion.  May  add Pseudoephedrine (30mg , one or two every 4 to 6 hours) for sinus congestion.  Get adequate rest.   May use Afrin nasal spray (or generic oxymetazoline) each morning for about 5 days and then discontinue.  Also recommend using saline nasal spray several times daily and saline nasal irrigation (AYR is a common brand).  Use Flonase nasal spray each morning after using Afrin nasal spray and saline nasal irrigation. Try warm salt water gargles for sore throat.  Stop all antihistamines for now, and other non-prescription cough/cold preparations. May take Delsym Cough Suppressant Tessalon at bedtime for nighttime cough.      ED Prescriptions    Medication Sig Dispense Auth. Provider   doxycycline (VIBRAMYCIN) 100 MG capsule Take 1 capsule (100 mg total) by mouth 2 (two) times daily. Take with food. 20 capsule Lattie Haw, MD   predniSONE (DELTASONE) 20 MG tablet Take one tab by mouth twice daily for 4 days, then one daily. Take with food. 12 tablet Lattie Haw, MD   benzonatate (TESSALON) 200 MG capsule Take one cap by mouth at bedtime as needed for cough.  May repeat in 4 to 6 hours 15 capsule Cathren Harsh Tera Mater, MD        Lattie Haw, MD 11/27/17 [phone]

## 2018-03-01 IMAGING — DX DG CHEST 2V
2 series · 2 of 2 positions shown · non-contrast
Comparison: 04/27/2015.

CLINICAL DATA: Chest discomfort for 1 month.

EXAM:
CHEST  2 VIEW

[chest pa]
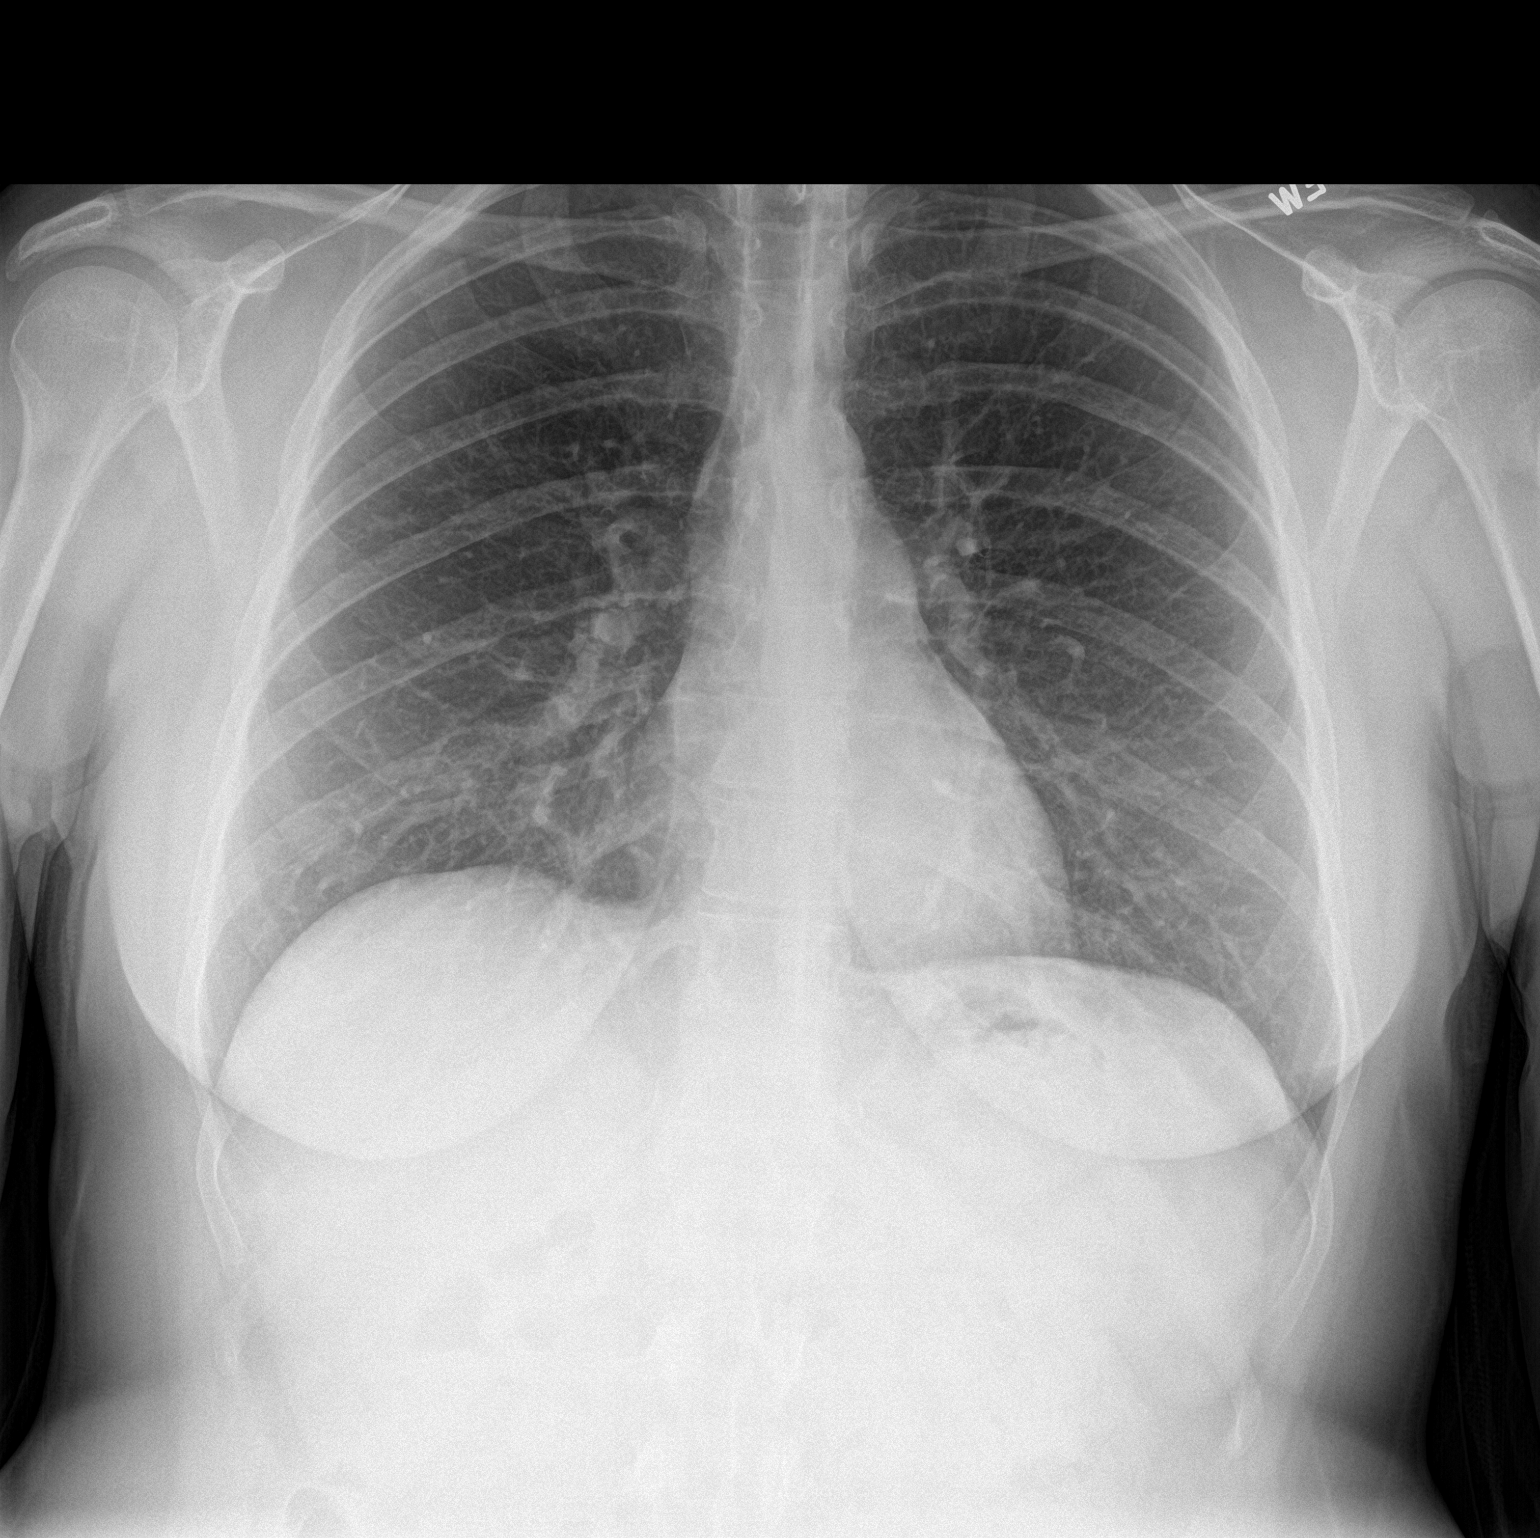

[chest lat]
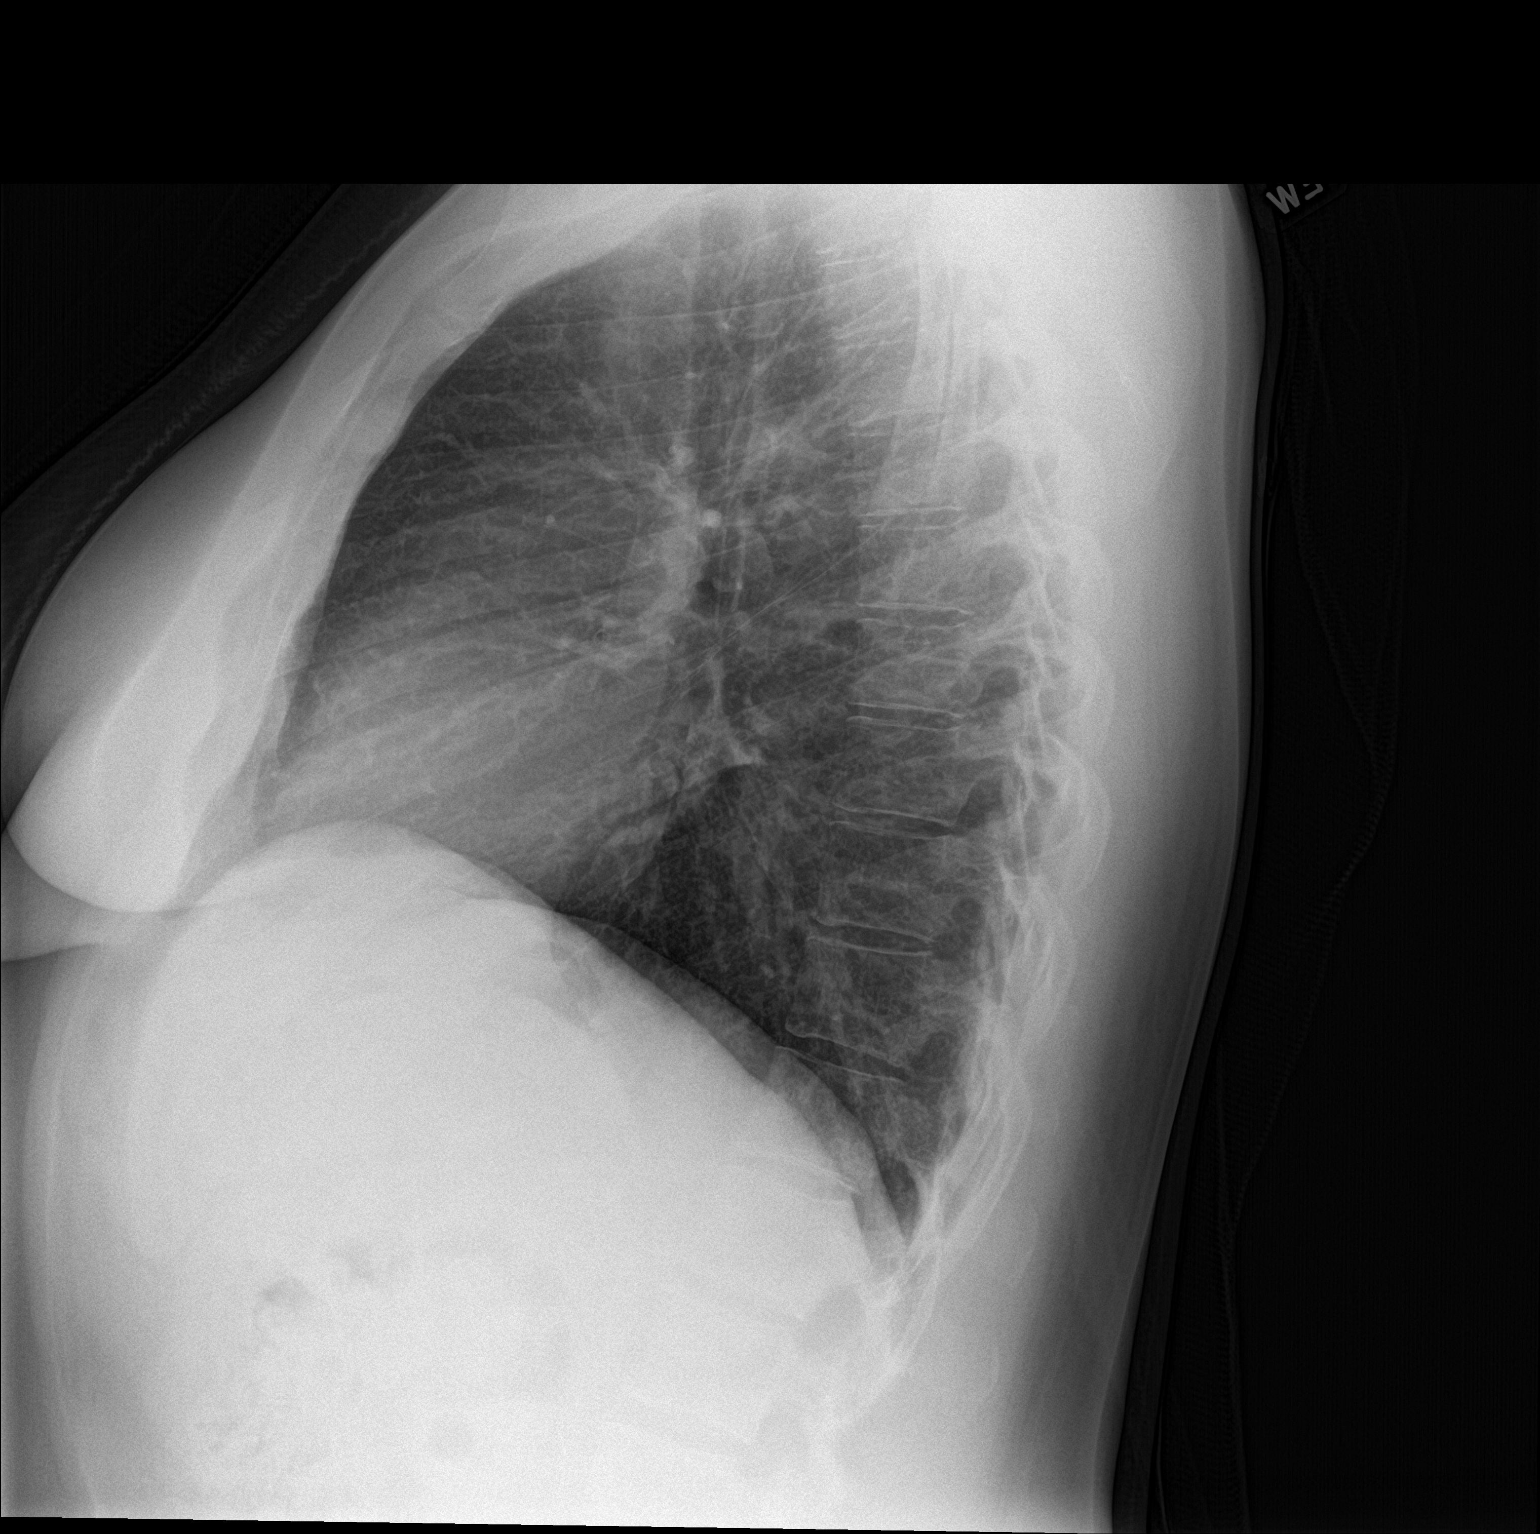

[2 of 2 positions shown; findings below may reference images not displayed]

FINDINGS: Trachea is midline. Heart size normal. Lungs are clear. No pleural
fluid.
IMPRESSION: No acute findings.

## 2018-09-16 ENCOUNTER — Other Ambulatory Visit: Payer: Self-pay

## 2018-09-16 ENCOUNTER — Encounter: Payer: Self-pay | Admitting: Osteopathic Medicine

## 2018-09-16 ENCOUNTER — Ambulatory Visit (INDEPENDENT_AMBULATORY_CARE_PROVIDER_SITE_OTHER): Payer: BC Managed Care – PPO | Admitting: Osteopathic Medicine

## 2018-09-16 VITALS — BP 139/71 | HR 72 | Temp 99.0°F | Wt 230.0 lb

## 2018-09-16 DIAGNOSIS — M25562 Pain in left knee: Secondary | ICD-10-CM

## 2018-09-16 DIAGNOSIS — M25561 Pain in right knee: Secondary | ICD-10-CM

## 2018-09-16 DIAGNOSIS — F1021 Alcohol dependence, in remission: Secondary | ICD-10-CM | POA: Insufficient documentation

## 2018-09-16 DIAGNOSIS — Z87898 Personal history of other specified conditions: Secondary | ICD-10-CM | POA: Insufficient documentation

## 2018-09-16 DIAGNOSIS — F418 Other specified anxiety disorders: Secondary | ICD-10-CM

## 2018-09-16 DIAGNOSIS — S29012A Strain of muscle and tendon of back wall of thorax, initial encounter: Secondary | ICD-10-CM

## 2018-09-16 DIAGNOSIS — R202 Paresthesia of skin: Secondary | ICD-10-CM | POA: Diagnosis not present

## 2018-09-16 HISTORY — DX: Personal history of other specified conditions: Z87.898

## 2018-09-16 NOTE — Progress Notes (Signed)
HPI: Andrea Esparza is a 28 y.o. female who  has a past medical history of Alcohol abuse (01/04/2015), Generalized anxiety disorder (04/27/2015), IUD (intrauterine device) in place (01/04/2015), and Major depression, chronic (04/27/2015).  she presents to [facility] today, 09/16/18,  for chief complaint of:  Aches and pains Recent quit EtOH  Reports longstanding alcohol use/abuse, would quit intermittently but then start up again.  Most recently prior to quitting, she was drinking 1.5-2 bottles of wine per day.  She thinks she was very likely self-medicating for anxiety/depression.  1 day she just realized that she had to start taking care of herself and so 2 weeks ago she quit alcohol cold [place].  No history of delirium tremens/seizure or other withdrawal problems.  Reports some increased anxiety/depression, would like to get set up with a counselor.  Reports aching in the upper back on the right side, some pain in the lateral chest wall on the right as well, no chest pain/pressure on exertion, no palpitations, no shortness of breath or dyspnea on exertion.  No cough, no fever.  Reports some knee pain a few days ago after standing for a long period of time, also had some tingling sensations in bilateral feet.  This seems to have resolved at this point.       At today's visit 09/16/18 ... PMH, PSH, FH reviewed and updated as needed.  Current medication list and allergy/intolerance hx reviewed and updated as needed. (See remainder of HPI, ROS, Phys Exam below)   No results found.  No results found for this or any previous visit (from the past 72 hour(s)).        ASSESSMENT/PLAN: The primary encounter diagnosis was History of alcoholism ([place]). Diagnoses of Quit drinking alcohol, Strain of rhomboid muscle, initial encounter, Pain in both knees, unspecified chronicity, Paresthesia, and Anxious depression were also pertinent to this  visit.  Information printed on rhomboid strain, I think this explains musculoskeletal pain.  Possible patellofemoral type pain, but this seems to have resolved at this point.  Transient paresthesia, will check B12 levels as well as thiamine.  Patient feels pretty confident that she is noticing some somatic effects that she has been ignoring for a while, as well as some mental health issues that she has satisfied.    Would like to get mental and physical health under control, she is working on increasing exercise, healthier diet.  She does not feel like she wants to be on medications at this point.  She does not feel that a 12-step program is right for her.  She is eager to get mental health counseling to address underlying anxiety/depression issues it sounds like she is on the right track and with close follow-up hopefully we can stick to the plan!  Orders Placed This Encounter  Procedures  . CBC  . COMPLETE METABOLIC PANEL WITH GFR  . TSH  . VITAMIN D 25 Hydroxy (Vit-D Deficiency, Fractures)  . Vitamin B12  . Vitamin B1  . Ambulatory referral to [facility]     No orders of the defined types were placed in this encounter.       Follow-up plan: Return in about 2 weeks (around 09/30/2018) for recheck aches/pains, review labs. See me sooner if needed! .                                                 ################################################# ################################################# ################################################# #################################################  Current Meds  Medication Sig  . levonorgestrel (MIRENA) 20 MCG/24HR IUD 1 each by Intrauterine route once.    Allergies  Allergen Reactions  . Penicillins Other (See Comments) and Hives    In childhood        Review of Systems:  Constitutional: No recent illness  HEENT: No  headache, no vision change  Cardiac: No  chest  pain, No  pressure, No palpitations  Respiratory:  No  shortness of breath. No  Cough  Gastrointestinal: No  abdominal pain, no change on bowel habits  Musculoskeletal: +new myalgia/arthralgia  Skin: No  Rash  Neurologic: No  weakness, No  Dizziness  Psychiatric: +concerns with depression, +concerns with anxiety     Exam:  BP 139/71 (BP Location: Left Arm, Patient Position: Sitting, Cuff Size: Normal)   Pulse 72   Temp 99 F (37.2 C) (Oral)   Wt 230 lb (104.3 kg)   BMI 37.12 kg/m   Constitutional: VS see above. General Appearance: alert, well-developed, well-nourished, NAD  Eyes: Normal lids and conjunctive, non-icteric sclera  Neck: No masses, trachea midline.   Respiratory: Normal respiratory effort. no wheeze, no rhonchi, no rales  Cardiovascular: S1/S2 normal, no murmur, no rub/gallop auscultated. RRR.   Musculoskeletal: Gait normal. Symmetric and independent movement of all extremities.  Tender point in right rhomboid, some tenderness to lateral ribs/axillary region, no lymphadenopathy, patient denies breast lumps.  Neurological: Normal balance/coordination. No tremor.  Skin: warm, dry, intact.   Psychiatric: Normal judgment/insight. Normal mood and affect. Oriented x3.       Visit summary with medication list and pertinent instructions was printed for patient to review, patient was advised to alert us if any updates are needed. All questions at time of visit were answered - patient instructed to contact office with any additional concerns. ER/RTC precautions were reviewed with the patient and understanding verbalized.   Please note: voice recognition software was used to produce this document, and typos may escape review. Please contact Dr. Lyn HollingsheadAlexander for any needed clarifications.    Follow up plan: Return in about 2 weeks (around 09/30/2018) for recheck aches/pains, review labs. See me sooner if needed! .Marland Kitchen

## 2018-09-19 LAB — COMPLETE METABOLIC PANEL WITH GFR
AG Ratio: 1.7 (calc) (ref 1.0–2.5)
ALT: 23 U/L (ref 6–29)
AST: 17 U/L (ref 10–30)
Albumin: 4.3 g/dL (ref 3.6–5.1)
Alkaline phosphatase (APISO): 79 U/L (ref 31–125)
BUN: 9 mg/dL (ref 7–25)
CO2: 25 mmol/L (ref 20–32)
Calcium: 9.3 mg/dL (ref 8.6–10.2)
Chloride: 106 mmol/L (ref 98–110)
Creat: 0.91 mg/dL (ref 0.50–1.10)
GFR, Est African American: 98 mL/min/{1.73_m2} (ref >=60)
GFR, Est Non African American: 85 mL/min/{1.73_m2} (ref >=60)
Globulin: 2.5 g/dL (calc) (ref 1.9–3.7)
Glucose, Bld: 136 mg/dL — ABNORMAL HIGH (ref 65–99)
Potassium: 4 mmol/L (ref 3.5–5.3)
Sodium: 140 mmol/L (ref 135–146)
Total Bilirubin: 0.5 mg/dL (ref 0.2–1.2)
Total Protein: 6.8 g/dL (ref 6.1–8.1)

## 2018-09-19 LAB — VITAMIN B1: Vitamin B1 (Thiamine): <6 nmol/L — ABNORMAL LOW (ref 8–30)

## 2018-09-19 LAB — CBC
HCT: 42 % (ref 35.0–45.0)
Hemoglobin: 14.4 g/dL (ref 11.7–15.5)
MCH: 31.9 pg (ref 27.0–33.0)
MCHC: 34.3 g/dL (ref 32.0–36.0)
MCV: 92.9 fL (ref 80.0–100.0)
MPV: 10.3 fL (ref 7.5–12.5)
Platelets: 293 10*3/uL (ref 140–400)
RBC: 4.52 10*6/uL (ref 3.80–5.10)
RDW: 12.1 % (ref 11.0–15.0)
WBC: 8.9 10*3/uL (ref 3.8–10.8)

## 2018-09-19 LAB — VITAMIN D 25 HYDROXY (VIT D DEFICIENCY, FRACTURES): Vit D, 25-Hydroxy: 8 ng/mL — ABNORMAL LOW (ref 30–100)

## 2018-09-19 LAB — TSH: TSH: 2.16 mIU/L

## 2018-09-19 LAB — VITAMIN B12: Vitamin B-12: 299 pg/mL (ref 200–1100)

## 2018-09-23 MED ORDER — VITAMIN D (ERGOCALCIFEROL) 1.25 MG (50000 UNIT) PO CAPS: 50000.0000 [IU] | ORAL_CAPSULE | ORAL | 0 refills | Status: DC

## 2018-09-23 MED ORDER — VITAMIN B-1 100 MG PO TABS: 100.0000 mg | ORAL_TABLET | Freq: Every day | ORAL | 0 refills | Status: AC

## 2018-09-23 NOTE — Addendum Note (Signed)
Addended by: Maryla Morrow on: 09/23/2018 06:42 PM   Modules accepted: Orders

## 2018-09-30 ENCOUNTER — Ambulatory Visit: Payer: BC Managed Care – PPO | Admitting: Osteopathic Medicine

## 2018-10-01 ENCOUNTER — Encounter: Payer: Self-pay | Admitting: Osteopathic Medicine

## 2018-10-01 ENCOUNTER — Ambulatory Visit (INDEPENDENT_AMBULATORY_CARE_PROVIDER_SITE_OTHER): Payer: BC Managed Care – PPO | Admitting: Osteopathic Medicine

## 2018-10-01 VITALS — Temp 97.9°F | Wt 226.5 lb

## 2018-10-01 DIAGNOSIS — R42 Dizziness and giddiness: Secondary | ICD-10-CM

## 2018-10-01 DIAGNOSIS — G44209 Tension-type headache, unspecified, not intractable: Secondary | ICD-10-CM | POA: Diagnosis not present

## 2018-10-01 NOTE — Progress Notes (Signed)
Virtual Visit via Video (App used: Doximity) Note  I connected with      Andrea Barlowaitlin H Scarpino on 10/01/18 at 7:49 AM by a telemedicine application and verified that I am speaking with the correct person using two identifiers.  Patient is at home I am in office    I discussed the limitations of evaluation and management by telemedicine and the availability of in person appointments. The patient expressed understanding and agreed to proceed.  History of Present Illness: Andrea Esparza is a 28 y.o. female who would like to discuss headache, dizziness, fatigue   Seen 09/16/18 (little over 2 weeks ago) we discussed alcohol use and anxiety/depression. She had quit drinking about 2 weeks prior to the visit. Noted increased anxiety and depression with quitting and we discussed getting set up with a counselor. She also noted some aches/pains in back, chest wall, knees and feet that she had been ignoring for some time. Declined medication management for anxiety/depression, I placed referral for behavioral health. We also checked labs which showed significantly low vitamin D and vitamin B1/thiamine but all else normal including B12 and TSH. Glc above normal but pt was not fasting.   Today 10/01/18 reports headache, dizziness, fatigue x4 days. Headache described as aching, usually frontal bilateral but can be in back of head, ibuprofen helps somewhat. Dizziness described as spinning sensation with head movement. Reports fatigue and difficulty concentrating.  Nothing she can think of that might have brought this on except she was on vacation for a week or so and symptoms all started when she came back to work.       Observations/Objective: Temp 97.9 F (36.6 C) (Oral)   Wt 226 lb 8 oz (102.7 kg)   BMI 36.56 kg/m  BP Readings from Last 3 Encounters:  09/16/18 139/71  11/25/17 127/85  02/06/17 118/79   Exam: Normal Speech.  NAD  Lab and Radiology Results No results found for this or any  previous visit (from the past 72 hour(s)). No results found.  [facility] 10/01/2018 01/04/2015  Decreased Interest 1 1  Down, Depressed, Hopeless 1 1  PHQ - 2 Score 2 2  Altered sleeping 0 3  Tired, decreased energy 2 2  Change in appetite 0 2  Feeling bad or failure about yourself  3 2  Trouble concentrating 1 1  Moving slowly or fidgety/restless 0 0  Suicidal thoughts 1 0  PHQ-9 Score 9 12  Difficult doing work/chores Very difficult Somewhat difficult      Assessment and Plan: 28 y.o. female with The primary encounter diagnosis was Dizziness. A diagnosis of Tension headache was also pertinent to this visit.   PDMP not reviewed this encounter. No orders of the defined types were placed in this encounter.  No orders of the defined types were placed in this encounter.  Patient Instructions  Without a full physical exam/additional testing, I am not sure exactly what might be causing her symptoms but I feel pretty confident in saying this is likely a combination of stress, possibly nicotine withdrawal, possibly benign vertigo, likely these things are not helped by being vitamin deficient in vitamin D and vitamin B1 though I doubt that these vitamin deficiencies would totally explain your symptoms.  I think we can try over-the-counter Excedrin Migraine medication for the headache issues.  The headache sounds mostly like tension headache, rather than migraine, but the over-the-counter Excedrin Migraine will treat this as well.  Dizziness most likely due to vertigo  as opposed to dropping blood pressure or other cause.  Can try the Epley maneuver below, can try Benadryl or Dramamine and see if this might help.  If not better, or if worse or change, please let us know.  If that happens, I would like to see you in the office so that we can do a complete physical exam to see if we can get to the bottom of this.   See below or can Art therapist, can do this  to both directions      Instructions sent via MyChart. If MyChart not available, pt was given option for info via personal e-mail w/ no guarantee of protected health info over unsecured e-mail communication, and MyChart sign-up instructions were included.   Follow Up Instructions: Return if symptoms worsen or fail to improve in one week.    I discussed the assessment and treatment plan with the patient. The patient was provided an opportunity to ask questions and all were answered. The patient agreed with the plan and demonstrated an understanding of the instructions.   The patient was advised to call back or seek an in-person evaluation if any new concerns, if symptoms worsen or if the condition fails to improve as anticipated.  25 minutes of non-face-to-face time was provided during this encounter.                      Historical information moved to improve visibility of documentation.  Past Medical History:  Diagnosis Date  . Alcohol abuse 01/04/2015   Reports 12 drinks per week and self/others concerned about this use   . Generalized anxiety disorder 04/27/2015  . IUD (intrauterine device) in place 01/04/2015  . Major depression, chronic 04/27/2015   Past Surgical History:  Procedure Laterality Date  . MOUTH SURGERY  2004/2005   Social History   Tobacco Use  . Smoking status: Current Every Day Smoker    Packs/day: 1.00    Types: E-cigarettes  . Smokeless tobacco: Never Used  Substance Use Topics  . Alcohol use: Yes   family history includes Alcohol abuse in her maternal aunt, maternal uncle, and paternal grandfather; Cancer in her maternal grandfather; Depression in her father; Diabetes in her paternal grandmother; Heart attack in her paternal grandfather.  Medications: Current Outpatient Medications  Medication Sig Dispense Refill  . levonorgestrel (MIRENA) 20 MCG/24HR IUD 1 each by Intrauterine route once.    . thiamine (VITAMIN B-1) 100 MG  tablet Take 1 tablet (100 mg total) by mouth daily. 90 tablet 0  . Vitamin D, Ergocalciferol, (DRISDOL) 1.25 MG (50000 UT) CAPS capsule Take 1 capsule (50,000 Units total) by mouth every 7 (seven) days. Take for 12 total doses(weeks) than can transition to 1000 units OTC supplement daily 12 capsule 0   No current facility-administered medications for this visit.    Allergies  Allergen Reactions  . Penicillins Other (See Comments) and Hives    In childhood     PDMP not reviewed this encounter. No orders of the defined types were placed in this encounter.  No orders of the defined types were placed in this encounter.

## 2018-10-01 NOTE — Patient Instructions (Signed)
Without a full physical exam/additional testing, I am not sure exactly what might be causing her symptoms but I feel pretty confident in saying this is likely a combination of stress, possibly nicotine withdrawal, possibly benign vertigo, likely these things are not helped by being vitamin deficient in vitamin D and vitamin B1 though I doubt that these vitamin deficiencies would totally explain your symptoms.  I think we can try over-the-counter Excedrin Migraine medication for the headache issues.  The headache sounds mostly like tension headache, rather than migraine, but the over-the-counter Excedrin Migraine will treat this as well.  Dizziness most likely due to vertigo as opposed to dropping blood pressure or other cause.  Can try the Epley maneuver below, can try Benadryl or Dramamine and see if this might help.  If not better, or if worse or change, please let us know.  If that happens, I would like to see you in the office so that we can do a complete physical exam to see if we can get to the bottom of this.   See below or can Art therapist, can do this to both directions

## 2019-01-12 ENCOUNTER — Encounter: Payer: Self-pay | Admitting: Physician Assistant

## 2019-01-12 ENCOUNTER — Other Ambulatory Visit: Payer: Self-pay

## 2019-01-12 ENCOUNTER — Ambulatory Visit (INDEPENDENT_AMBULATORY_CARE_PROVIDER_SITE_OTHER): Payer: BC Managed Care – PPO | Admitting: Physician Assistant

## 2019-01-12 VITALS — BP 125/87 | HR 121 | Temp 98.1°F | Wt 224.0 lb

## 2019-01-12 VITALS — Temp 98.3°F | Ht 66.0 in | Wt 219.0 lb

## 2019-01-12 DIAGNOSIS — R5383 Other fatigue: Secondary | ICD-10-CM

## 2019-01-12 DIAGNOSIS — F17200 Nicotine dependence, unspecified, uncomplicated: Secondary | ICD-10-CM | POA: Insufficient documentation

## 2019-01-12 DIAGNOSIS — F419 Anxiety disorder, unspecified: Secondary | ICD-10-CM

## 2019-01-12 DIAGNOSIS — R1084 Generalized abdominal pain: Secondary | ICD-10-CM | POA: Insufficient documentation

## 2019-01-12 DIAGNOSIS — R8290 Unspecified abnormal findings in urine: Secondary | ICD-10-CM | POA: Diagnosis not present

## 2019-01-12 DIAGNOSIS — R82998 Other abnormal findings in urine: Secondary | ICD-10-CM | POA: Diagnosis not present

## 2019-01-12 DIAGNOSIS — E559 Vitamin D deficiency, unspecified: Secondary | ICD-10-CM

## 2019-01-12 DIAGNOSIS — R0600 Dyspnea, unspecified: Secondary | ICD-10-CM

## 2019-01-12 DIAGNOSIS — R000 Tachycardia, unspecified: Secondary | ICD-10-CM

## 2019-01-12 DIAGNOSIS — R002 Palpitations: Secondary | ICD-10-CM

## 2019-01-12 DIAGNOSIS — R0609 Other forms of dyspnea: Secondary | ICD-10-CM

## 2019-01-12 DIAGNOSIS — I493 Ventricular premature depolarization: Secondary | ICD-10-CM | POA: Insufficient documentation

## 2019-01-12 DIAGNOSIS — R109 Unspecified abdominal pain: Secondary | ICD-10-CM

## 2019-01-12 HISTORY — DX: Other forms of dyspnea: R06.09

## 2019-01-12 HISTORY — DX: Tachycardia, unspecified: R00.0

## 2019-01-12 HISTORY — DX: Other fatigue: R53.83

## 2019-01-12 HISTORY — DX: Unspecified abnormal findings in urine: R82.90

## 2019-01-12 HISTORY — DX: Dyspnea, unspecified: R06.00

## 2019-01-12 HISTORY — DX: Palpitations: R00.2

## 2019-01-12 LAB — POCT URINALYSIS DIP (CLINITEK)
Bilirubin, UA: NEGATIVE
Blood, UA: NEGATIVE
Glucose, UA: NEGATIVE mg/dL
Ketones, POC UA: NEGATIVE mg/dL
Nitrite, UA: NEGATIVE
POC PROTEIN,UA: NEGATIVE
Spec Grav, UA: 1.02 (ref 1.010–1.025)
Urobilinogen, UA: 0.2 E.U./dL
pH, UA: 7.5 (ref 5.0–8.0)

## 2019-01-12 MED ORDER — SERTRALINE HCL 25 MG PO TABS: ORAL_TABLET | ORAL | 0 refills | Status: DC

## 2019-01-12 NOTE — Patient Instructions (Addendum)
Fatigue If you have fatigue, you feel tired all the time and have a lack of energy or a lack of motivation. Fatigue may make it difficult to start or complete tasks because of exhaustion. In general, occasional or mild fatigue is often a normal response to activity or life. However, long-lasting (chronic) or extreme fatigue may be a symptom of a medical condition. Follow these instructions at home: General instructions  Watch your fatigue for any changes.  Go to bed and get up at the same time every day.  Avoid fatigue by pacing yourself during the day and getting enough sleep at night.  Maintain a healthy weight. Medicines  Take over-the-counter and prescription medicines only as told by your health care provider.  Take a multivitamin, if told by your health care provider.  Do not use herbal or dietary supplements unless they are approved by your health care provider. Activity   Exercise regularly, as told by your health care provider.  Use or practice techniques to help you relax, such as yoga, tai chi, meditation, or massage therapy. Eating and drinking   Avoid heavy meals in the evening.  Eat a well-balanced diet, which includes lean proteins, whole grains, plenty of fruits and vegetables, and low-fat dairy products.  Avoid consuming too much caffeine.  Avoid the use of alcohol.  Drink enough fluid to keep your urine pale yellow. Lifestyle  Change situations that cause you stress. Try to keep your work and personal schedule in balance.  Do not use any products that contain nicotine or tobacco, such as cigarettes and e-cigarettes. If you need help quitting, ask your health care provider.  Do not use drugs. Contact a health care provider if:  Your fatigue does not get better.  You have a fever.  You suddenly lose or gain weight.  You have headaches.  You have trouble falling asleep or sleeping through the night.  You feel angry, guilty, anxious, or sad.   You are unable to have a bowel movement (constipation).  Your skin is dry.  You have swelling in your legs or another part of your body. Get help right away if:  You feel confused.  Your vision is blurry.  You feel faint or you pass out.  You have a severe headache.  You have severe pain in your abdomen, your back, or the area between your waist and hips (pelvis).  You have chest pain, shortness of breath, or an irregular or fast heartbeat.  You are unable to urinate, or you urinate less than normal.  You have abnormal bleeding, such as bleeding from the rectum, vagina, nose, lungs, or nipples.  You vomit blood.  You have thoughts about hurting yourself or others. If you ever feel like you may hurt yourself or others, or have thoughts about taking your own life, get help right away. You can go to your nearest emergency department or call:  Your local emergency services (911 in the [place]  A suicide crisis helpline, such as the [place] at (505)831-7817. This is open 24 hours a day. Summary  If you have fatigue, you feel tired all the time and have a lack of energy or a lack of motivation.  Fatigue may make it difficult to start or complete tasks because of exhaustion.  Long-lasting (chronic) or extreme fatigue may be a symptom of a medical condition.  Exercise regularly, as told by your health care provider.  Change situations that cause you stress. Try to keep your  work and personal schedule in balance. This information is not intended to replace advice given to you by your health care provider. Make sure you discuss any questions you have with your health care provider. Document Released: 11/18/2006 Document Revised: 05/14/2018 Document Reviewed: 10/16/2016 Elsevier Patient Education  2020 [employer].   Premature Ventricular Contraction  A premature ventricular contraction (PVC) is a common kind of irregular heartbeat (arrhythmia).  These contractions are extra heartbeats that start in the ventricles of the heart and occur too early in the normal sequence. During the PVC, the heart's normal electrical pathway is not used, so the beat is shorter and less effective. In most cases, these contractions come and go and do not require treatment. What are the causes? Common causes of the condition include:  Smoking.  Drinking alcohol.  Certain medicines.  Some illegal drugs.  Stress.  Caffeine. Certain medical conditions can also cause PVCs:  Heart failure.  Heart attack, or coronary artery disease.  Heart valve problems.  Changes in minerals in the blood (electrolytes).  Low blood oxygen levels or high carbon dioxide levels. In many cases, the cause of this condition is not known. What are the signs or symptoms? The main symptom of this condition is fast or skipped heartbeats (palpitations). Other symptoms include:  Chest pain.  Shortness of breath.  Feeling tired.  Dizziness.  Difficulty exercising. In some cases, there are no symptoms. How is this diagnosed? This condition may be diagnosed based on:  Your medical history.  A physical exam. During the exam, the health care provider will check for irregular heartbeats.  Tests, such as: ? An ECG (electrocardiogram) to monitor the electrical activity of your heart. ? An ambulatory cardiac monitor. This device records your heartbeats for 24 hours or more. ? Stress tests to see how exercise affects your heart rhythm and blood supply. ? An echocardiogram. This test uses sound waves (ultrasound) to produce an image of your heart. ? An electrophysiology study (EPS). This test checks for electrical problems in your heart. How is this treated? Treatment for this condition depends on any underlying conditions, the type of PVCs that you are having, and how much the symptoms are interfering with your daily life. Possible treatments include:  Avoiding things  that cause premature contractions (triggers). These include caffeine and alcohol.  Taking medicines if symptoms are severe or if the extra heartbeats are frequent.  Getting treatment for underlying conditions that cause PVCs.  Having an implantable cardioverter defibrillator (ICD), if you are at risk for a serious arrhythmia. The ICD is a small device that is inserted into your chest to monitor your heartbeat. When it senses an irregular heartbeat, it sends a shock to bring the heartbeat back to normal.  Having a procedure to destroy the portion of the heart tissue that sends out abnormal signals (catheter ablation). In some cases, no treatment is required. Follow these instructions at home: Lifestyle  Do not use any products that contain nicotine or tobacco, such as cigarettes, e-cigarettes, and chewing tobacco. If you need help quitting, ask your health care provider.  Do not use illegal drugs.  Exercise regularly. Ask your health care provider what type of exercise is safe for you.  Try to get at least 7-9 hours of sleep each night, or as much as recommended by your health care provider.  Find healthy ways to manage stress. Avoid stressful situations when possible. Alcohol use  Do not drink alcohol if: ? Your health care provider tells  you not to drink. ? You are pregnant, may be pregnant, or are planning to become pregnant. ? Alcohol triggers your episodes.  If you drink alcohol: ? Limit how much you use to:  0-1 drink a day for women.  0-2 drinks a day for men.  Be aware of how much alcohol is in your drink. In the [place] one drink equals one 12 oz bottle of beer (355 mL), one 5 oz glass of wine (148 mL), or one 1 oz glass of hard liquor (44 mL). General instructions  Take over-the-counter and prescription medicines only as told by your health care provider.  If caffeine triggers episodes of PVC, do not eat, drink, or use anything with caffeine in it.  Keep all  follow-up visits as told by your health care provider. This is important. Contact a health care provider if you:  Feel palpitations. Get help right away if you:  Have chest pain.  Have shortness of breath.  Have sweating for no reason.  Have nausea and vomiting.  Become light-headed or you faint. Summary  A premature ventricular contraction (PVC) is a common kind of irregular heartbeat (arrhythmia).  In most cases, these contractions come and go and do not require treatment.  You may need to wear an ambulatory cardiac monitor. This records your heartbeats for 24 hours or more.  Treatment depends on any underlying conditions, the type of PVCs that you are having, and how much the symptoms are interfering with your daily life. This information is not intended to replace advice given to you by your health care provider. Make sure you discuss any questions you have with your health care provider. Document Released: 09/08/2003 Document Revised: 10/16/2017 Document Reviewed: 10/16/2017 Elsevier Patient Education  2020 [employer].

## 2019-01-12 NOTE — Progress Notes (Signed)
Virtual Visit via Video (App used: Doximity) Note  I connected with      Andrea Esparza on 01/12/19 at 2:35 PM  by a telemedicine application and verified that I am speaking with the correct person using two identifiers.  Patient is at residence in [place], [place] I am in office   I discussed the limitations of evaluation and management by telemedicine and the availability of in person appointments. The patient expressed understanding and agreed to proceed.  History of Present Illness: Andrea Esparza is a 28 y.o. female who would like to discuss multiple concerns: fatigue, SOB, abdominal pain  Patient with PMH of vitamin D deficiency, anxiety, depression Recurrent fatigue for the last 3 weeks Wakes up feeling like she has not slept despite 7 hrs of sleep Denies hypersomnia or excessive daytime sleepiness She is having some difficulty concentrating and retaining information Mood is "not terrible. I don't feel like I'm terribly depressed." Anxiety is chronic, she feels it is unchanged. She has been sober for the last 4-5 months  She has nasal congestion, sneezing, sore throat, chills, chest tightness and shortness of breath for the last 2 weeks. These symptoms are gradually improving. She states SOB is mild, mainly with household tasks and did not really concern her.  She does smoke e-cigarettes 1 pod per day (x 2 months), transitioned from smoking 1 ppd of cigarettes x 14 years. Denies hx of COPD/asthma/pulmonary disease.  Denies fever, sinus pain or pressure  Left-sided abdominal pain near her umbilicus, waxing and waning. Pain sometimes is present on the right side. Described as a mild ache. Began approx 2 months ago. Was associated with constipation, but does not feel constipated lately. She is having regular BM's approx once per day. Denies melena/hematochezia/mucus. She has had some intermittent rectal pain with passage of stool.  She states she sometimes has  heartburn/reflux with acidic foods and onions. Denies   She has noticed urine has a "pronounced smell" x 1 week. Denies hematuria, dysuria, urgency, frequency, flank pain. She has a history of UTI when she was younger, but has not had an infection for multiple years.       Observations/Objective: Temp 98.3 F (36.8 C) (Oral)   Ht 5\' 6"  (1.676 m)   Wt 219 lb (99.3 kg)   BMI 35.35 kg/m  BP Readings from Last 3 Encounters:  09/16/18 139/71  11/25/17 127/85  02/06/17 118/79   Exam: Limited by telehealth Normal Speech.    Lab and Radiology Results No results found for this or any previous visit (from the past 72 hour(s)). No results found. Lab Results  Component Value Date   CREATININE 0.91 09/16/2018   BUN 9 09/16/2018   NA 140 09/16/2018   K 4.0 09/16/2018   CL 106 09/16/2018   CO2 25 09/16/2018   Lab Results  Component Value Date   ALT 23 09/16/2018   AST 17 09/16/2018   ALKPHOS 62 02/14/2016   BILITOT 0.5 09/16/2018   Lab Results  Component Value Date   WBC 8.9 09/16/2018   HGB 14.4 09/16/2018   HCT 42.0 09/16/2018   MCV 92.9 09/16/2018   PLT 293 09/16/2018   Lab Results  Component Value Date   TSH 2.16 09/16/2018       Assessment and Plan: 28 y.o. female with The primary encounter diagnosis was Malodorous urine. Diagnoses of Fatigue, unspecified type, Dyspnea on exertion, and Left sided abdominal pain were also pertinent to this visit.   PDMP  not reviewed this encounter. No orders of the defined types were placed in this encounter.  No orders of the defined types were placed in this encounter.  There are no Patient Instructions on file for this visit.   Scheduled for in-person evaluation at 4:20 pm today  Instructions sent via MyChart. If MyChart not available, pt was given option for info via personal e-mail w/ no guarantee of protected health info over unsecured e-mail communication, and MyChart sign-up instructions were sent to patient.    Follow Up Instructions: No follow-ups on file.    I discussed the assessment and treatment plan with the patient. The patient was provided an opportunity to ask questions and all were answered. The patient agreed with the plan and demonstrated an understanding of the instructions.   The patient was advised to call back or seek an in-person evaluation if any new concerns, if symptoms worsen or if the condition fails to improve as anticipated.  19 minutes of non-face-to-face time was provided during this encounter.      . . . . . . . . . . . . . Marland Kitchen                   Historical information moved to improve visibility of documentation.  Past Medical History:  Diagnosis Date  . Alcohol abuse 01/04/2015   Reports 12 drinks per week and self/others concerned about this use   . Generalized anxiety disorder 04/27/2015  . IUD (intrauterine device) in place 01/04/2015  . Major depression, chronic 04/27/2015   Past Surgical History:  Procedure Laterality Date  . MOUTH SURGERY  2004/2005   Social History   Tobacco Use  . Smoking status: Current Every Day Smoker    Packs/day: 1.00    Types: E-cigarettes  . Smokeless tobacco: Never Used  Substance Use Topics  . Alcohol use: Yes   family history includes Alcohol abuse in her maternal aunt, maternal uncle, and paternal grandfather; Cancer in her maternal grandfather; Depression in her father; Diabetes in her paternal grandmother; Heart attack in her paternal grandfather.  Medications: Current Outpatient Medications  Medication Sig Dispense Refill  . levonorgestrel (MIRENA) 20 MCG/24HR IUD 1 each by Intrauterine route once.     No current facility-administered medications for this visit.    Allergies  Allergen Reactions  . Penicillins Other (See Comments) and Hives    In childhood

## 2019-01-12 NOTE — Progress Notes (Signed)
HPI:                                                                Andrea Esparza is a 28 y.o. female who presents to [facility] today for multiple concerns  See telehealth note from earlier today for complete HPI  Admits to drinking 2 cups of coffee this afternoon and smoking e-cigarette prior to appt. Denies hx of VTE.   Admits she has had more anxiety lately as well as sleep difficulties. Has taken Zoloft in the past and this was helpful. She is sober and not having any cravings. She is interested in possibly re-starting anxiety medications.  Denies SI/HI. Denies AH/VH. Denies sx of mania.  Depression screen [facility] 2/9 01/12/2019 10/01/2018 01/04/2015  Decreased Interest 1 1 1   Down, Depressed, Hopeless 1 1 1   PHQ - 2 Score 2 2 2   Altered sleeping 2 0 3  Tired, decreased energy 3 2 2   Change in appetite 2 0 2  Feeling bad or failure about yourself  3 3 2   Trouble concentrating 1 1 1   Moving slowly or fidgety/restless 0 0 0  Suicidal thoughts 0 1 0  PHQ-9 Score 13 9 12   Difficult doing work/chores Somewhat difficult Very difficult Somewhat difficult   GAD 7 : Generalized Anxiety Score 01/12/2019 10/01/2018 01/04/2015  Nervous, Anxious, on Edge 2 2 3   Control/stop worrying 3 1 2   Worry too much - different things 3 2 3   Trouble relaxing 1 0 3  Restless 0 1 2  Easily annoyed or irritable 2 2 2   Afraid - awful might happen 1 1 2   Total GAD 7 Score 12 9 17   Anxiety Difficulty Very difficult Very difficult Somewhat difficult      Past Medical History:  Diagnosis Date  . Alcohol abuse 01/04/2015   Reports 12 drinks per week and self/others concerned about this use   . Generalized anxiety disorder 04/27/2015  . IUD (intrauterine device) in place 01/04/2015  . Major depression, chronic 04/27/2015   Past Surgical History:  Procedure Laterality Date  . MOUTH SURGERY  2004/2005   Social History   Tobacco Use  . Smoking  status: Former Smoker    Packs/day: 1.00    Years: 14.00    Pack years: 14.00    Types: E-cigarettes, Cigarettes    Quit date: 11/05/2018    Years since quitting: 0.1  . Smokeless tobacco: Never Used  Substance Use Topics  . Alcohol use: Not Currently    Comment: sober since July 2020   family history includes Alcohol abuse in her maternal aunt, maternal uncle, and paternal grandfather; Cancer in her maternal grandfather; Depression in her father; Diabetes in her paternal grandmother; Heart attack in her paternal grandfather.   Review of Systems  Constitutional: Positive for fatigue. Negative for appetite change, chills, fever and unexpected weight change.  HENT: Positive for congestion, rhinorrhea and sore throat.   Respiratory: Positive for chest tightness and shortness of breath. Negative for cough and wheezing.        + vaping   Cardiovascular: Positive for palpitations. Negative for chest pain and leg swelling.  Gastrointestinal: Positive for abdominal pain (colicky) and rectal pain. Negative for anal bleeding, blood in  stool, constipation and nausea.  Genitourinary: Negative for decreased urine volume, difficulty urinating, dysuria, flank pain and frequency.       + urine odor  Musculoskeletal: Negative for arthralgias, joint swelling and myalgias.  Skin: Negative for rash.  Psychiatric/Behavioral: Positive for decreased concentration and sleep disturbance. The patient is nervous/anxious.      Medications: Current Outpatient Medications  Medication Sig Dispense Refill  . levonorgestrel (MIRENA) 20 MCG/24HR IUD 1 each by Intrauterine route once.    . sertraline (ZOLOFT) 25 MG tablet Take 1 tablet (25 mg total) by mouth at bedtime for 7 days, THEN 2 tablets (50 mg total) at bedtime for 23 days. 60 tablet 0   No current facility-administered medications for this visit.    Allergies  Allergen Reactions  . Penicillins Other (See Comments) and Hives    In childhood         Objective:  BP 125/87   Pulse (!) 121   Temp 98.1 F (36.7 C) (Oral)   Wt 224 lb (101.6 kg)   BMI 36.15 kg/m   Wt Readings from Last 3 Encounters:  01/12/19 224 lb (101.6 kg)  01/12/19 219 lb (99.3 kg)  10/01/18 226 lb 8 oz (102.7 kg)   Temp Readings from Last 3 Encounters:  01/12/19 98.1 F (36.7 C) (Oral)  01/12/19 98.3 F (36.8 C) (Oral)  10/01/18 97.9 F (36.6 C) (Oral)   BP Readings from Last 3 Encounters:  01/12/19 125/87  09/16/18 139/71  11/25/17 127/85   Pulse Readings from Last 3 Encounters:  01/12/19 (!) 121  09/16/18 72  11/25/17 96    Gen:  alert, not ill-appearing, no distress, appropriate for age 12: head normocephalic without obvious abnormality, conjunctiva and cornea clear, trachea midline, thyroid not enlarged, non-tender, no cervical adenopathy Pulm: Normal work of breathing, normal phonation, clear to auscultation bilaterally, no wheezes, rales or rhonchi CV: tachycardic rate, irregular rhythm, s1 and s2 distinct, no murmurs, clicks or rubs  GI: abdomen soft, non-tender, no guarding or rigidity Neuro: alert and oriented x 3, no tremor MSK: extremities atraumatic, normal gait and station Skin: intact, no rashes on exposed skin, no jaundice, no cyanosis Psych: well-groomed, cooperative, good eye contact, euthymic mood, affect mood-congruent, speech is articulate, and thought processes clear and goal-directed  6 Min Walk 99% RA, 131 with ambulation  Results for orders placed or performed in visit on 01/12/19 (from the past 72 hour(s))  POCT URINALYSIS DIP (CLINITEK)     Status: Abnormal   Collection Time: 01/12/19  4:44 PM  Result Value Ref Range   Color, UA yellow yellow   Clarity, UA clear clear   Glucose, UA negative negative mg/dL   Bilirubin, UA negative negative   Ketones, POC UA negative negative mg/dL   Spec Grav, UA 1.020 1.010 - 1.025   Blood, UA negative negative   pH, UA 7.5 5.0 - 8.0   POC PROTEIN,UA negative  negative, trace   Urobilinogen, UA 0.2 0.2 or 1.0 E.U./dL   Nitrite, UA Negative Negative   Leukocytes, UA Trace (A) Negative   No results found.  ECG 01/12/19 4:55pm Vent rate 102 bpm PR-I 146 ms QRS 90 ms QT/QTc 338/440 ms Sinus tachycardia with occasional PVC's  Assessment and Plan: 28 y.o. female with   .Diagnoses and all orders for this visit:  Fatigue, unspecified type -     C-reactive protein -     Ferritin -     Sedimentation rate -     Vit  D  25 hydroxy (rtn osteoporosis monitoring) -     CBC with Differential -     CMP w/GFR -     TSH + free T4  Tachycardia with heart rate 121-140 beats per minute -     CBC with Differential -     CMP w/GFR -     TSH + free T4 -     EKG 12-Lead  Malodorous urine -     POCT URINALYSIS DIP (CLINITEK) -     Urine Culture  Urine leukocytes increased -     Urine Culture  Vitamin D deficiency -     Vit D  25 hydroxy (rtn osteoporosis monitoring) -     Vitamin D, Ergocalciferol, (DRISDOL) 1.25 MG (50000 UT) CAPS capsule; Take 1 capsule (50,000 Units total) by mouth every 7 (seven) days. Take for 8 total doses(weeks)  Colicky abdominal pain -     C-reactive protein -     Sedimentation rate -     CMP w/GFR  Palpitations -     EKG 12-Lead  PVC (premature ventricular contraction)  Nicotine dependence due to vaping non-tobacco product  Anxiety disorder, unspecified type -     sertraline (ZOLOFT) 25 MG tablet; Take 1 tablet (25 mg total) by mouth at bedtime for 7 days, THEN 2 tablets (50 mg total) at bedtime for 23 days.  DOE Passed 6 min walk test No hypoxia No adventitious lung sounds  Tachycardia Admits to drinking 2 cups of coffee this afternoon and smoking e-cigarette prior to appt. Denies hx of VTE. Anxiety is also contributory ECG shows sinus tach at 102 bpm with PVC Counseled to reduce nicotine/caffeine and hydrate Counseled on ED/return precautions   Anxiety and depression   [facility]  PHQ-9 Total Score  13      No acute safety issues GAD7=12 Re-start Sertraline. Start 25 mg QHS, self-titrate to 50 mg  Fatigue  Multifactorial in the setting of uncontrolled anxiety, sleep disturbance and vit d deficiency Lab work-up as above Discuss Vit D supplementation  Urinary odor UA unremarkable, trace leuks No other signs/symptoms of cystitis/pyelo Urine cx pending  Patient education and anticipatory guidance given Patient agrees with treatment plan Follow-up with PCP in 3 weeks as needed if symptoms worsen or fail to improve  Levonne Hubertharley E. Junita Kubota PA-C

## 2019-01-13 DIAGNOSIS — R5383 Other fatigue: Secondary | ICD-10-CM | POA: Diagnosis not present

## 2019-01-13 DIAGNOSIS — R1084 Generalized abdominal pain: Secondary | ICD-10-CM | POA: Diagnosis not present

## 2019-01-13 DIAGNOSIS — E559 Vitamin D deficiency, unspecified: Secondary | ICD-10-CM | POA: Diagnosis not present

## 2019-01-13 DIAGNOSIS — R000 Tachycardia, unspecified: Secondary | ICD-10-CM | POA: Diagnosis not present

## 2019-01-14 LAB — CBC WITH DIFFERENTIAL/PLATELET
Absolute Monocytes: 512 cells/uL (ref 200–950)
Basophils Absolute: 42 cells/uL (ref 0–200)
Basophils Relative: 0.5 %
Eosinophils Absolute: 59 cells/uL (ref 15–500)
Eosinophils Relative: 0.7 %
HCT: 42.5 % (ref 35.0–45.0)
Hemoglobin: 14.4 g/dL (ref 11.7–15.5)
Lymphs Abs: 2898 cells/uL (ref 850–3900)
MCH: 31 pg (ref 27.0–33.0)
MCHC: 33.9 g/dL (ref 32.0–36.0)
MCV: 91.4 fL (ref 80.0–100.0)
MPV: 9.9 fL (ref 7.5–12.5)
Monocytes Relative: 6.1 %
Neutro Abs: 4889 cells/uL (ref 1500–7800)
Neutrophils Relative %: 58.2 %
Platelets: 271 10*3/uL (ref 140–400)
RBC: 4.65 10*6/uL (ref 3.80–5.10)
RDW: 12 % (ref 11.0–15.0)
Total Lymphocyte: 34.5 %
WBC: 8.4 10*3/uL (ref 3.8–10.8)

## 2019-01-14 LAB — COMPLETE METABOLIC PANEL WITH GFR
AG Ratio: 2 (calc) (ref 1.0–2.5)
ALT: 20 U/L (ref 6–29)
AST: 14 U/L (ref 10–30)
Albumin: 4.4 g/dL (ref 3.6–5.1)
Alkaline phosphatase (APISO): 86 U/L (ref 31–125)
BUN: 8 mg/dL (ref 7–25)
CO2: 25 mmol/L (ref 20–32)
Calcium: 9.4 mg/dL (ref 8.6–10.2)
Chloride: 104 mmol/L (ref 98–110)
Creat: 0.91 mg/dL (ref 0.50–1.10)
GFR, Est African American: 98 mL/min/{1.73_m2} (ref >=60)
GFR, Est Non African American: 85 mL/min/{1.73_m2} (ref >=60)
Globulin: 2.2 g/dL (calc) (ref 1.9–3.7)
Glucose, Bld: 110 mg/dL — ABNORMAL HIGH (ref 65–99)
Potassium: 4.3 mmol/L (ref 3.5–5.3)
Sodium: 138 mmol/L (ref 135–146)
Total Bilirubin: 0.4 mg/dL (ref 0.2–1.2)
Total Protein: 6.6 g/dL (ref 6.1–8.1)

## 2019-01-14 LAB — C-REACTIVE PROTEIN: CRP: 2.8 mg/L (ref <8.0)

## 2019-01-14 LAB — URINE CULTURE
MICRO NUMBER:: 1177445
SPECIMEN QUALITY:: ADEQUATE

## 2019-01-14 LAB — VITAMIN D 25 HYDROXY (VIT D DEFICIENCY, FRACTURES): Vit D, 25-Hydroxy: 13 ng/mL — ABNORMAL LOW (ref 30–100)

## 2019-01-14 LAB — FERRITIN: Ferritin: 155 ng/mL — ABNORMAL HIGH (ref 16–154)

## 2019-01-14 LAB — TSH+FREE T4: TSH W/REFLEX TO FT4: 2.07 mIU/L

## 2019-01-14 LAB — SEDIMENTATION RATE: Sed Rate: 6 mm/h (ref 0–20)

## 2019-01-14 MED ORDER — VITAMIN D (ERGOCALCIFEROL) 1.25 MG (50000 UNIT) PO CAPS: 50000.0000 [IU] | ORAL_CAPSULE | ORAL | 0 refills | Status: DC

## 2019-02-02 ENCOUNTER — Encounter: Payer: Self-pay | Admitting: Osteopathic Medicine

## 2019-02-02 ENCOUNTER — Other Ambulatory Visit: Payer: Self-pay

## 2019-02-02 ENCOUNTER — Ambulatory Visit (INDEPENDENT_AMBULATORY_CARE_PROVIDER_SITE_OTHER): Payer: BC Managed Care – PPO | Admitting: Osteopathic Medicine

## 2019-02-02 VITALS — Temp 97.6°F | Wt 219.0 lb

## 2019-02-02 DIAGNOSIS — R0789 Other chest pain: Secondary | ICD-10-CM | POA: Diagnosis not present

## 2019-02-02 DIAGNOSIS — R1032 Left lower quadrant pain: Secondary | ICD-10-CM

## 2019-02-02 DIAGNOSIS — R109 Unspecified abdominal pain: Secondary | ICD-10-CM

## 2019-02-02 MED ORDER — ADVAIR HFA 115-21 MCG/ACT IN AERO: 2.0000 | INHALATION_SPRAY | Freq: Two times a day (BID) | RESPIRATORY_TRACT | 0 refills | Status: DC

## 2019-02-02 NOTE — Progress Notes (Signed)
Virtual Visit via Video (App used: Doximity) Note  I connected with      Staci Righter on 02/02/19 at 11:19 AM  by a telemedicine application and verified that I am speaking with the correct person using two identifiers.  Patient is at home I am in office   I discussed the limitations of evaluation and management by telemedicine and the availability of in person appointments. The patient expressed understanding and agreed to proceed.  History of Present Illness: Andrea Esparza is a 28 y.o. female who would like to discuss abdominal pain, chest tightness   Saw [facility] 3 weeks ago for fatigue, anxiety, sore throat/allergies EKG and 6-min walk ok  Restarted Zoloft  Started on Vitamin D Restarted the Zoloft, has noticed some small improvement.  Allergies, sore throat symptoms have mostly cleared up   Still having some intermittent GI upset - in the past has been concurrent w/ anxiety, newer L abdominal pain coming and going didn't really seem connected to anxiety or to bowel habits, not too painful but was new so wanted to mention it. It's still coming and going. Feels more with lying down on that side of the body. Feels like a dull ache.   SOB and tightness in the chest are a bit better but not gone. Ongoing since 5-6 weeks ago. Was having palpitations but these have resolved. Pulse has been ok. Attributes symptoms somewhat to anxiety    Observations/Objective: Temp 97.6 F (36.4 C) (Oral)   Wt 219 lb (99.3 kg)   BMI 35.35 kg/m  BP Readings from Last 3 Encounters:  01/12/19 125/87  09/16/18 139/71  11/25/17 127/85   Exam: Normal Speech.  NAD  Lab and Radiology Results No results found for this or any previous visit (from the past 72 hour(s)). No results found.     Assessment and Plan: 28 y.o. female with The primary encounter diagnosis was Left lateral abdominal pain. Diagnoses of Left lower quadrant abdominal pain and Feeling of chest tightness were also  pertinent to this visit.  Continue current meds Will get CT abdomen, no red flag symptoms, suspect possible IBS, eval for mass/diverticular disease though this seems unlikely CXR, Wells 0, trial inhaler  Social History   Tobacco Use  Smoking Status Former Smoker  . Packs/day: 1.00  . Years: 14.00  . Pack years: 14.00  . Types: E-cigarettes, Cigarettes  . Quit date: 11/05/2018  . Years since quitting: 0.2  Smokeless Tobacco Never Used   PDMP not reviewed this encounter. Orders Placed This Encounter  Procedures  . CT Abdomen Pelvis W Contrast    Order Specific Question:   If indicated for the ordered procedure, I authorize the administration of contrast media per Radiology protocol    Answer:   Yes    Order Specific Question:   Is patient pregnant?    Answer:   No    Order Specific Question:   Preferred imaging location?    Answer:   [facility]    Order Specific Question:   Is Oral Contrast requested for this exam?    Answer:   Yes, Per Radiology protocol    Order Specific Question:   Radiology Contrast Protocol - do NOT remove file path    Answer:   \\[email or web address]  . DG Chest 2 View    Order Specific Question:   Reason for Exam (SYMPTOM  OR DIAGNOSIS REQUIRED)    Answer:   chest tightness    Order Specific Question:  Is patient pregnant?    Answer:   No    Order Specific Question:   Preferred imaging location?    Answer:   [facility]    Order Specific Question:   Radiology Contrast Protocol - do NOT remove file path    Answer:   \\[email or web address]   Meds ordered this encounter  Medications  . fluticasone-salmeterol (ADVAIR HFA) 115-21 MCG/ACT inhaler    Sig: Inhale 2 puffs into the lungs 2 (two) times daily.    Dispense:  1 Inhaler    Refill:  0      Follow Up Instructions: Return for RECHECK PENDING RESULTS / IF WORSE OR CHANGE.    I discussed the assessment and  treatment plan with the patient. The patient was provided an opportunity to ask questions and all were answered. The patient agreed with the plan and demonstrated an understanding of the instructions.   The patient was advised to call back or seek an in-person evaluation if any new concerns, if symptoms worsen or if the condition fails to improve as anticipated.  25 minutes of non-face-to-face time was provided during this encounter.      . . . . . . . . . . . . . Marland Kitchen                   Historical information moved to improve visibility of documentation.  Past Medical History:  Diagnosis Date  . Alcohol abuse 01/04/2015   Reports 12 drinks per week and self/others concerned about this use   . Generalized anxiety disorder 04/27/2015  . IUD (intrauterine device) in place 01/04/2015  . Major depression, chronic 04/27/2015   Past Surgical History:  Procedure Laterality Date  . MOUTH SURGERY  2004/2005   Social History   Tobacco Use  . Smoking status: Former Smoker    Packs/day: 1.00    Years: 14.00    Pack years: 14.00    Types: E-cigarettes, Cigarettes    Quit date: 11/05/2018    Years since quitting: 0.2  . Smokeless tobacco: Never Used  Substance Use Topics  . Alcohol use: Not Currently    Comment: sober since July 2020   family history includes Alcohol abuse in her maternal aunt, maternal uncle, and paternal grandfather; Cancer in her maternal grandfather; Depression in her father; Diabetes in her paternal grandmother; Heart attack in her paternal grandfather.  Medications: Current Outpatient Medications  Medication Sig Dispense Refill  . levonorgestrel (MIRENA) 20 MCG/24HR IUD 1 each by Intrauterine route once.    . sertraline (ZOLOFT) 25 MG tablet Take 1 tablet (25 mg total) by mouth at bedtime for 7 days, THEN 2 tablets (50 mg total) at bedtime for 23 days. 60 tablet 0  . Vitamin D, Ergocalciferol, (DRISDOL) 1.25 MG (50000 UT) CAPS capsule  Take 1 capsule (50,000 Units total) by mouth every 7 (seven) days. Take for 8 total doses(weeks) 8 capsule 0   No current facility-administered medications for this visit.   Allergies  Allergen Reactions  . Penicillins Other (See Comments) and Hives    In childhood

## 2019-02-03 ENCOUNTER — Encounter: Payer: Self-pay | Admitting: Osteopathic Medicine

## 2019-02-09 ENCOUNTER — Other Ambulatory Visit: Payer: Self-pay

## 2019-02-09 ENCOUNTER — Ambulatory Visit (INDEPENDENT_AMBULATORY_CARE_PROVIDER_SITE_OTHER): Payer: No Typology Code available for payment source

## 2019-02-09 DIAGNOSIS — R1032 Left lower quadrant pain: Secondary | ICD-10-CM

## 2019-02-09 DIAGNOSIS — R0789 Other chest pain: Secondary | ICD-10-CM | POA: Diagnosis not present

## 2019-02-09 MED ORDER — IOHEXOL 300 MG/ML  SOLN
100.0000 mL | Freq: Once | INTRAMUSCULAR | Status: AC | PRN
  Administered 2019-02-09: 100 mL via INTRAVENOUS

## 2019-03-01 ENCOUNTER — Other Ambulatory Visit: Payer: Self-pay | Admitting: Physician Assistant

## 2019-03-01 DIAGNOSIS — F419 Anxiety disorder, unspecified: Secondary | ICD-10-CM

## 2019-03-02 MED ORDER — SERTRALINE HCL 50 MG PO TABS: 50.0000 mg | ORAL_TABLET | Freq: Every day | ORAL | 1 refills | Status: DC

## 2019-03-02 NOTE — Telephone Encounter (Signed)
[facility] pharmacy requesting med refills for zoloft. As per pharmacy pt taking 50 mg daily. Last rx was titrated. Pls update rx and forward to pharmacy.

## 2019-03-12 ENCOUNTER — Other Ambulatory Visit: Payer: Self-pay

## 2019-03-12 MED ORDER — ADVAIR HFA 115-21 MCG/ACT IN AERO: 2.0000 | INHALATION_SPRAY | Freq: Two times a day (BID) | RESPIRATORY_TRACT | 5 refills | Status: DC

## 2019-04-26 ENCOUNTER — Encounter: Payer: Self-pay | Admitting: Medical-Surgical

## 2019-05-31 ENCOUNTER — Ambulatory Visit (INDEPENDENT_AMBULATORY_CARE_PROVIDER_SITE_OTHER): Payer: 59 | Admitting: Licensed Clinical Social Worker

## 2019-05-31 DIAGNOSIS — F419 Anxiety disorder, unspecified: Secondary | ICD-10-CM

## 2019-05-31 DIAGNOSIS — F329 Major depressive disorder, single episode, unspecified: Secondary | ICD-10-CM | POA: Diagnosis not present
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
## 2019-06-17 ENCOUNTER — Ambulatory Visit (INDEPENDENT_AMBULATORY_CARE_PROVIDER_SITE_OTHER): Payer: 59 | Admitting: Licensed Clinical Social Worker

## 2019-06-17 DIAGNOSIS — F32A Depression, unspecified: Secondary | ICD-10-CM

## 2019-06-17 DIAGNOSIS — F329 Major depressive disorder, single episode, unspecified: Secondary | ICD-10-CM

## 2019-06-17 DIAGNOSIS — F419 Anxiety disorder, unspecified: Secondary | ICD-10-CM | POA: Diagnosis not present

## 2019-06-17 NOTE — Progress Notes (Signed)
Virtual Visit via Video Note  I connected with Andrea Esparza on 06/17/19 at 10:00 AM EDT by a video enabled telemedicine application and verified that I am speaking with the correct person using two identifiers.   I discussed the limitations of evaluation and management by telemedicine and the availability of in person appointments. The patient expressed understanding and agreed to proceed.   I discussed the assessment and treatment plan with the patient. The patient was provided an opportunity to ask questions and all were answered. The patient agreed with the plan and demonstrated an understanding of the instructions.   The patient was advised to call back or seek an in-person evaluation if the symptoms worsen or if the condition fails to improve as anticipated.  I provided 54 minutes of non-face-to-face time during this encounter.   THERAPIST PROGRESS NOTE  Session Time: 10:00 AM to 10:54 AM  Participation Level: Active  Behavioral Response: CasualAlertAnxious and Depressed  Type of Therapy: Individual Therapy  Treatment Goals addressed: decrease anxiety and depression, coping skills, self-esteem, address past trauma  Interventions: Solution Focused, Strength-based, Reframing and Other: education about trauma, interpersonal relationships and schemas, self-esteem,   Summary: Andrea Esparza is a 29 y.o. female who presents with may recognize that behavior caused by past relationships. Hasn't processed feelings about past relationships and doesn't know how to put them in past. Haunt her. Instead of dealing with the emotions tend to bury it. Hard to know how to put them in past and move forward.  Therapist reviewed trauma work relates to reviewing narrative to dehabituate to the memories, restructure our narrative and at first learning emotional regulation skills to begin trauma work, Patient shares  hard for her to take emotions and feelings out of it, hard for her to talk about.   She reviewed impact of past on current relationships and explains almost like she projects that her husband is going to react to her thoughts and feelings even though don't have a basis for feeling that way. Afraid of sharing those thoughts and feelings because of fear. Some of it is fear of being judged. Have a general fear of men, intimidated by men because of past experiences. Not intimidated of husband, but hesitant to share, even though don't have a valid reason to feel that way. Reviewed schemas created by the past that we reenact that are no longer applicable to present and the fact that we begin to recognize the schemas as a first step in being able to step out of it and being able to change and challenge it.  (See below).  Work with patient on recognizing where it comes from in the past but also question she can ask to begin to change challenge her narrative which is a subjective 1.  (See below) with patient on changing narrative as well through a perception of herself that strengthens her self-esteem and how to work on that, a narrative of others that shows less concern for how they feel about her, but using an internal basis of value as well as to guide herself. Patient elaborated more on her interpersonal schemas she identifies. Feels of worthlessness and not good enough, fear of being judged. Fixation of people judging her which she really dislikes. Like to live life without considering what other people are thinking. Reviewed session and what was helpful and patient shares helpful to remind herself that lot of voices are not her own. Thinking about things tell herself that were told by somebody else,  from relationships where not treated very well, internalize them as her own thoughts about herself. Reminding herself what is currently happening is not the past. Should not assigning expected outcomes to a current conversation.  Suicidal/Homicidal: No  Therapist Response: Therapist reviewed symptoms,  facilitated expression of thoughts and feelings, provided overview of trauma work that includes first emotional regulation skills then working slowly into the narrative to the habituate as well as to develop new narratives.  In this session worked on identifying interpersonal schemas created from the past which are an accurate and unhelpful.  Therapist provided formation on schemas their structures formed early in life in the context of relationships with caregivers.  That they are modifiable.  Patterns are identified and assumptions about relationships are more adequately evaluated there is room for trying out alternative ways of interacting which could significantly improve current relationships.  Therapist pointed out If you want to change the story we have to understand where it came from and where we received the messages we tell ourselves.  Whose voices are we internalizing.  Therapist related once we identify situations that led to narrative we can recognize they are no longer relevant to current situation allowing [place] to challenge and change narrative.  Reviewed and asked questions to help in changing narrative such as what else could I feel/believe about myself?  What else could expect the other person to act/respond to me, what alternative action could I take.  Questions help as well to change his narrative.  Able to identify some of patient's narratives that she wants to work on changing, her narrative change will also include in changing how she feels about herself, changing schema in terms of expectations of others that include worrying less about what other people think, not allowing them to have such significant part of her life, that people are less focused on her as well as narrative recognizing unconditional worth.  Therapist provided strength based and supportive intervention.   Plan: Return again in 4 weeks.2.  Continue to work with patient on impact of past relationships to current  relationships and changing and challenging negative schemas develop from all relationships. 3.  Work with patient on self-esteem decrease of depression and anxiety  Diagnosis: Axis I: Anxiety and Depression    Axis II: No diagnosis    Coolidge Breeze, LCSW 06/17/2019

## 2019-07-13 ENCOUNTER — Ambulatory Visit (HOSPITAL_COMMUNITY): Payer: 59 | Admitting: Licensed Clinical Social Worker

## 2019-08-03 ENCOUNTER — Ambulatory Visit (INDEPENDENT_AMBULATORY_CARE_PROVIDER_SITE_OTHER): Payer: No Typology Code available for payment source | Admitting: Licensed Clinical Social Worker

## 2019-08-03 DIAGNOSIS — F329 Major depressive disorder, single episode, unspecified: Secondary | ICD-10-CM

## 2019-08-03 DIAGNOSIS — F419 Anxiety disorder, unspecified: Secondary | ICD-10-CM

## 2019-08-03 NOTE — Progress Notes (Signed)
Virtual Visit via Video Note  Therapist-office Patient-home I connected with Andrea Esparza on 08/03/19 at  4:00 PM EDT by a video enabled telemedicine application and verified that I am speaking with the correct person using two identifiers.   I discussed the limitations of evaluation and management by telemedicine and the availability of in person appointments. The patient expressed understanding and agreed to proceed.  I discussed the assessment and treatment plan with the patient. The patient was provided an opportunity to ask questions and all were answered. The patient agreed with the plan and demonstrated an understanding of the instructions.   The patient was advised to call back or seek an in-person evaluation if the symptoms worsen or if the condition fails to improve as anticipated.  I provided 56 minutes of non-face-to-face time during this encounter.  THERAPIST PROGRESS NOTE  Session Time: 4:01 PM to 4:57 PM  Participation Level: Active  Behavioral Response: CasualAlertAnxious  Type of Therapy: Individual Therapy  Treatment Goals addressed:  Patient work on communicating more freely without judgment, work of Immunologist and applying coping skills for anxiety, depression, trauma, work on self-esteem Interventions: CBT, Solution Focused, Strength-based, Supportive, Reframing and Other: coping  Summary: Andrea Esparza is a 29 y.o. female who presents check-in about mood and shares with job has been stressful so hasn't helped her anxiety levels. 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 Entitled because bringing the company their business and expect to be treated accordingly. Work longer than actual hours. Took yesterday off for a mental health day. Day off make seems as if made work situation worse. Feel valued for people  she works with, nice coworkers. Try not to get it to her, but can when lay awake and think about it. Take reputation seriously worked hard to establish not tarnish, should be able to draw a line, enough is enough, a lot of expectation on her. Hard not to assign so much consideration to their opinion when boss does. Therapist pointed out ways to look at situation that expecations can be unreasonable that helps with shifting responsible off of her. Patient shares she does struggle with balance. Neglects taking care of self. Does do things she enjoys likes to knit, to read. Thinks job drains energy, don't do things that would benefit like physical activity. Basically day involves work, [employer] and sit on couch.  Discussed patient beginning to work on this by setting small goals.  Patient describes scope of life very contained by working at home.  To be good for her to get out and agrees to plan of going out 3 to 4 days a week for at least 20 minutes.  Therapist also related gratitude for help with looking at things positively that can help with motivation.  Patient agrees to doing gratitude list.  Shared has been actively reminding herself there is no basis for believe that her husband will respond when she believes relationship there by practicing actively what we were working on her last session, has helped and she is a little more open with him. 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  Stressed working on Production assistant, radio sense of value that will help with internal voice that she is doing the best she can, also she is  competent to help develop her own internal standards rather than external ones that are stressful for her.  Discussed developing good relationship with herself very helpful coping with life stressors, helping with self-esteem, discussed more balance and setting boundaries in her life.  Discussed working on self-esteem by self nurturing activities, getting her needs met, identified for patient being more active as helpful so discussed setting small goals this something helpful and set those goals with patient.  Discussed enhancement of mood through gratitude may help with motivation.  Patient continue with strategy from last session as challenging her believes and presumptions interactions with her husband, therapist also adding cannot predict the future so those are things made up in her head.  Therapist provided strength based and supportive intervention. Completed treatment plan and patient gave consent to complete by video Plan: Return again in 4 weeks.(Plan is for patient to get on regular schedule of sessions every 2-week )2.  Patient will outside for 20 minutes, practice gratitude, continue to work on cognitive distortions and her believes about her relationship with husband. 3.  Therapist work on anxiety, self-esteem, work on patient being less concerned about judgment of others and communicating more freely  Diagnosis: Axis I: Anxiety and Depression   Axis II: No diagnosis    Andrea Register, LCSW 08/03/2019

## 2019-08-30 ENCOUNTER — Other Ambulatory Visit: Payer: Self-pay | Admitting: Osteopathic Medicine

## 2019-08-30 DIAGNOSIS — F419 Anxiety disorder, unspecified: Secondary | ICD-10-CM

## 2019-09-07 ENCOUNTER — Ambulatory Visit: Payer: No Typology Code available for payment source | Admitting: Nurse Practitioner

## 2019-09-07 ENCOUNTER — Other Ambulatory Visit: Payer: Self-pay | Admitting: Osteopathic Medicine

## 2019-09-07 ENCOUNTER — Other Ambulatory Visit: Payer: Self-pay

## 2019-09-07 ENCOUNTER — Encounter: Payer: Self-pay | Admitting: Nurse Practitioner

## 2019-09-07 VITALS — BP 123/77 | HR 81 | Temp 98.3°F | Ht 66.0 in | Wt 217.4 lb

## 2019-09-07 DIAGNOSIS — K625 Hemorrhage of anus and rectum: Secondary | ICD-10-CM | POA: Diagnosis not present

## 2019-09-07 DIAGNOSIS — F419 Anxiety disorder, unspecified: Secondary | ICD-10-CM

## 2019-09-07 LAB — CBC WITH DIFFERENTIAL/PLATELET
Absolute Monocytes: 546 cells/uL (ref 200–950)
Basophils Absolute: 47 cells/uL (ref 0–200)
Basophils Relative: 0.6 %
Eosinophils Absolute: 101 cells/uL (ref 15–500)
Eosinophils Relative: 1.3 %
HCT: 42.5 % (ref 35.0–45.0)
Hemoglobin: 14.3 g/dL (ref 11.7–15.5)
Lymphs Abs: 2714 cells/uL (ref 850–3900)
MCH: 31 pg (ref 27.0–33.0)
MCHC: 33.6 g/dL (ref 32.0–36.0)
MCV: 92.2 fL (ref 80.0–100.0)
MPV: 9.8 fL (ref 7.5–12.5)
Monocytes Relative: 7 %
Neutro Abs: 4391 cells/uL (ref 1500–7800)
Neutrophils Relative %: 56.3 %
Platelets: 242 10*3/uL (ref 140–400)
RBC: 4.61 10*6/uL (ref 3.80–5.10)
RDW: 12.1 % (ref 11.0–15.0)
Total Lymphocyte: 34.8 %
WBC: 7.8 10*3/uL (ref 3.8–10.8)

## 2019-09-07 LAB — IRON,TIBC AND FERRITIN PANEL
%SAT: 29 % (calc) (ref 16–45)
Ferritin: 136 ng/mL (ref 16–154)
Iron: 85 ug/dL (ref 40–190)
TIBC: 294 mcg/dL (calc) (ref 250–450)

## 2019-09-07 MED ORDER — LIDOCAINE 5 % EX OINT: 1.0000 "application " | TOPICAL_OINTMENT | Freq: Four times a day (QID) | CUTANEOUS | 11 refills | Status: DC | PRN

## 2019-09-07 MED ORDER — HYDROCORTISONE ACETATE 25 MG RE SUPP: 25.0000 mg | Freq: Two times a day (BID) | RECTAL | 2 refills | Status: DC | PRN

## 2019-09-07 NOTE — Progress Notes (Signed)
Acute Office Visit  Subjective:    Patient ID: Andrea Esparza, female    DOB: 08-21-1990, 29 y.o.   MRN: [id number]  Chief Complaint  Patient presents with  . Blood In Stools    started as bleeding with bowel movements but yesterday noticed bleeding without bowel movements, pain, itching, no hx of hemorrhoids    HPI Patient is in today for BRBPR that has been ongoing for several days. She reports the blood is present on the outside of the stool only. Yesterday, she noticed that blood was present in between bowel movements and she became concerned. She reports that her stools are increasingly soft and more frequent than usual, but no diarrhea. She does have some pressure and mild pain with bowel movements.  She states she has had intermittent periods where she feels the urge to go to the bathroom, but she is unable to pass stool for several years. She reports this was significantly worse when she was drinking heavily and has improved significantly since she stopped drinking alcohol a year ago. She correlates this with dehydration. She reports this occurs maybe once or twice a month now.   She denies black, tarry stool, diarrhea, nausea, vomiting, dizziness, lightheadedness, abdominal pain, rectal injury or penetration, history of hemorrhoids, family or personal history of GI disease, family history of colon cancer. She has not had a colonoscopy and has never been seen by GI.   She is no longer drinking any alcohol. She does endorse dehydration at times.   Past Medical History:  Diagnosis Date  . Alcohol abuse 01/04/2015   Reports 12 drinks per week and self/others concerned about this use   . Generalized anxiety disorder 04/27/2015  . IUD (intrauterine device) in place 01/04/2015  . Major depression, chronic 04/27/2015    Past Surgical History:  Procedure Laterality Date  . MOUTH SURGERY  2004/2005    Family History  Problem Relation Age of Onset  . Depression Father   . Alcohol  abuse Maternal Aunt   . Alcohol abuse Maternal Uncle   . Cancer Maternal Grandfather   . Diabetes Paternal Grandmother   . Alcohol abuse Paternal Grandfather   . Heart attack Paternal Grandfather     Social History   Socioeconomic History  . Marital status: Married    Spouse name: Not on file  . Number of children: Not on file  . Years of education: Not on file  . Highest education level: Not on file  Occupational History  . Not on file  Tobacco Use  . Smoking status: Former Smoker    Packs/day: 1.00    Years: 14.00    Pack years: 14.00    Types: E-cigarettes, Cigarettes    Quit date: 11/05/2018    Years since quitting: 0.8  . Smokeless tobacco: Never Used  Vaping Use  . Vaping Use: Every day  . Start date: 11/05/2018  . Devices: Jewl  Substance and Sexual Activity  . Alcohol use: Not Currently    Comment: sober since July 2020  . Drug use: Never  . Sexual activity: Yes    Partners: Male    Birth control/protection: I.U.D.  Other Topics Concern  . Not on file  Social History Narrative  . Not on file   Social Determinants of Health   Financial Resource Strain:   . Difficulty of Paying Living Expenses:   Food Insecurity:   . Worried About Programme researcher, broadcasting/film/video in the Last Year:   . [employer]  of Food in the Last Year:   Transportation Needs:   . Freight forwarder (Medical):   Marland Kitchen Lack of Transportation (Non-Medical):   Physical Activity:   . Days of Exercise per Week:   . Minutes of Exercise per Session:   Stress:   . Feeling of Stress :   Social Connections:   . Frequency of Communication with Friends and Family:   . Frequency of Social Gatherings with Friends and Family:   . Attends Religious Services:   . Active Member of Clubs or Organizations:   . Attends Banker Meetings:   Marland Kitchen Marital Status:   Intimate Partner Violence:   . Fear of Current or Ex-Partner:   . Emotionally Abused:   Marland Kitchen Physically Abused:   . Sexually Abused:      Outpatient Medications Prior to Visit  Medication Sig Dispense Refill  . cholecalciferol (VITAMIN D3) 25 MCG (1000 UNIT) tablet Take 1,000 Units by mouth daily.    Marland Kitchen levonorgestrel (MIRENA) 20 MCG/24HR IUD 1 each by Intrauterine route once.    . sertraline (ZOLOFT) 50 MG tablet Take 1 tablet (50 mg total) by mouth at bedtime. 90 tablet 1  . fluticasone-salmeterol (ADVAIR HFA) 115-21 MCG/ACT inhaler Inhale 2 puffs into the lungs 2 (two) times daily. (Patient not taking: Reported on 09/07/2019) 1 Inhaler 5  . Vitamin D, Ergocalciferol, (DRISDOL) 1.25 MG (50000 UT) CAPS capsule Take 1 capsule (50,000 Units total) by mouth every 7 (seven) days. Take for 8 total doses(weeks) (Patient not taking: Reported on 09/07/2019) 8 capsule 0   No facility-administered medications prior to visit.    Allergies  Allergen Reactions  . Penicillins Other (See Comments) and Hives    In childhood        Objective:    Physical Exam Vitals and nursing note reviewed. Exam conducted with a chaperone present.  Constitutional:      General: She is not in acute distress.    Appearance: Normal appearance. She is not ill-appearing.  HENT:     Head: Normocephalic.  Eyes:     Extraocular Movements: Extraocular movements intact.     Conjunctiva/sclera: Conjunctivae normal.     Pupils: Pupils are equal, round, and reactive to light.  Cardiovascular:     Rate and Rhythm: Normal rate and regular rhythm.     Pulses: Normal pulses.     Heart sounds: Normal heart sounds.  Pulmonary:     Effort: Pulmonary effort is normal.     Breath sounds: Normal breath sounds.  Abdominal:     General: Abdomen is flat. Bowel sounds are normal. There is no distension.     Palpations: Abdomen is soft. There is no mass.     Tenderness: There is no abdominal tenderness. There is no guarding or rebound.     Hernia: No hernia is present.  Genitourinary:    Exam position: Knee-chest position.     Rectum: Guaiac result positive.        Comments: Positive test for occult blood and mucous present with digital examination to rectum. Soft area of inflammation detected at the 5 o'clock position of the internal rectum consistent with internal hemorrhoid.  Musculoskeletal:        General: Normal range of motion.     Cervical back: Normal range of motion.  Skin:    General: Skin is warm and dry.     Capillary Refill: Capillary refill takes less than 2 seconds.  Neurological:     General: No  focal deficit present.     Mental Status: She is alert and oriented to person, place, and time.  Psychiatric:        Mood and Affect: Mood normal.        Behavior: Behavior normal.        Thought Content: Thought content normal.        Judgment: Judgment normal.     Ht 5\' 6"  (1.676 m)   Wt 217 lb 6.4 oz (98.6 kg)   BMI 35.09 kg/m  Wt Readings from Last 3 Encounters:  09/07/19 217 lb 6.4 oz (98.6 kg)  02/02/19 219 lb (99.3 kg)  01/12/19 224 lb (101.6 kg)    Health Maintenance Due  Topic Date Due  . INFLUENZA VACCINE  09/05/2019    There are no preventive care reminders to display for this patient.   Lab Results  Component Value Date   TSH 2.16 09/16/2018   Lab Results  Component Value Date   WBC 8.4 01/13/2019   HGB 14.4 01/13/2019   HCT 42.5 01/13/2019   MCV 91.4 01/13/2019   PLT 271 01/13/2019   Lab Results  Component Value Date   NA 138 01/13/2019   K 4.3 01/13/2019   CO2 25 01/13/2019   GLUCOSE 110 (H) 01/13/2019   BUN 8 01/13/2019   CREATININE 0.91 01/13/2019   BILITOT 0.4 01/13/2019   ALKPHOS 62 02/14/2016   AST 14 01/13/2019   ALT 20 01/13/2019   PROT 6.6 01/13/2019   ALBUMIN 4.4 02/14/2016   CALCIUM 9.4 01/13/2019   ANIONGAP 9 02/14/2016   Lab Results  Component Value Date   CHOL 213 (H) 01/04/2015   Lab Results  Component Value Date   HDL 60 01/04/2015   Lab Results  Component Value Date   LDLCALC 138 (H) 01/04/2015   Lab Results  Component Value Date   TRIG 76 01/04/2015    Lab Results  Component Value Date   CHOLHDL 3.6 01/04/2015   No results found for: HGBA1C     Assessment & Plan:   1. Rectal bleeding Painless BRBPR with defecation and intermittent leaking of blood between bowel movements starting yesterday.  Symptoms and presentation most correlate with internal hemorrhoid given her history of hard to pass stool.  She has not had an evaluation by GI in the past, and given the limited ability to visualize the rectal vault, I feel that a GI referral may be beneficial to rule out significant findings. Discussed this option with the patient and she is agreeable to see GI specialist for further evaluation. She is moving to [place] next week so we will try to find someone close to that area for her to see.  Hemoccult positive in the office today.  We will check CBC and iron stores to ensure no sign of anemia. She does not have any s/s of bleeding disorders, but if results are abnormal on CBC, will consider adding coagulation study.  Referral to GI placed.  Prescription provided for hydrocortisone suppository with instructions to use for at least one week and then as needed. Lidocaine gel also provided for internal application with suppository.  Instructions to follow-up if symptoms worsen or fail to improve.  Will follow laboratory results and notify patient of any changes to the plan of care.  - CBC with Differential/Platelet - Iron, TIBC and Ferritin Panel - Ambulatory referral to Gastroenterology - hydrocortisone (ANUSOL-HC) 25 MG suppository; Place 1 suppository (25 mg total) rectally 2 (two) times daily as needed  for hemorrhoids.  Dispense: 30 suppository; Refill: 2 - lidocaine (XYLOCAINE) 5 % ointment; Apply 1 application topically 4 (four) times daily as needed.  Dispense: 50 g; Refill: 11   Tollie Eth, NP

## 2019-09-07 NOTE — Patient Instructions (Signed)
If you begin to experience symptoms of increased bleeding, nausea, vomiting, diarrhea, blood mixed in your stools, black/tarry stools, abdominal pain that is not relieved with a bowel movement, or any other concerning symptoms- please do not hesitate to reach back out to me or go to the emergency room, especially if you are having dizziness, fatigue, or weakness.   I have placed the referral for GI. They will be in contact with you. If you do not hear from them within 7 business days, please let me know- you can send me a mychart message- and I will look into it.   Be sure you are staying well hydrated. You may want to consider increasing your fiber in your diet- Metamucil is a fiber supplement that can be taken daily if you can't get more fiber in foods.   If you are having hard stools or difficult to pass stools, you can use 1 capful of Miralax mixed in 4 ounces of a drink of your choice daily until your stools are regular.   You may also consider taking a stool softener daily docusate sodium is the generic form of Colace, which can safely be taken daily to help keep stools easy to pass.    Hemorrhoids Hemorrhoids are swollen veins that may develop:  In the butt (rectum). These are called internal hemorrhoids.  Around the opening of the butt (anus). These are called external hemorrhoids. Hemorrhoids can cause pain, itching, or bleeding. Most of the time, they do not cause serious problems. They usually get better with diet changes, lifestyle changes, and other home treatments. What are the causes? This condition may be caused by:  Having trouble pooping (constipation).  Pushing hard (straining) to poop.  Watery poop (diarrhea).  Pregnancy.  Being very overweight (obese).  Sitting for long periods of time.  Heavy lifting or other activity that causes you to strain.  Anal sex.  Riding a bike for a long period of time. What are the signs or symptoms? Symptoms of this condition  include:  Pain.  Itching or soreness in the butt.  Bleeding from the butt.  Leaking poop.  Swelling in the area.  One or more lumps around the opening of your butt. How is this diagnosed? A doctor can often diagnose this condition by looking at the affected area. The doctor may also:  Do an exam that involves feeling the area with a gloved hand (digital rectal exam).  Examine the area inside your butt using a small tube (anoscope).  Order blood tests. This may be done if you have lost a lot of blood.  Have you get a test that involves looking inside the colon using a flexible tube with a camera on the end (sigmoidoscopy or colonoscopy). How is this treated? This condition can usually be treated at home. Your doctor may tell you to change what you eat, make lifestyle changes, or try home treatments. If these do not help, procedures can be done to remove the hemorrhoids or make them smaller. These may involve:  Placing rubber bands at the base of the hemorrhoids to cut off their blood supply.  Injecting medicine into the hemorrhoids to shrink them.  Shining a type of light energy onto the hemorrhoids to cause them to fall off.  Doing surgery to remove the hemorrhoids or cut off their blood supply. Follow these instructions at home: Eating and drinking   Eat foods that have a lot of fiber in them. These include whole grains, beans, nuts,  fruits, and vegetables.  Ask your doctor about taking products that have added fiber (fibersupplements).  Reduce the amount of fat in your diet. You can do this by: ? Eating low-fat dairy products. ? Eating less red meat. ? Avoiding processed foods.  Drink enough fluid to keep your pee (urine) pale yellow. Managing pain and swelling   Take a warm-water bath (sitz bath) for 20 minutes to ease pain. Do this 3-4 times a day. You may do this in a bathtub or using a portable sitz bath that fits over the toilet.  If told, put ice on the  painful area. It may be helpful to use ice between your warm baths. ? Put ice in a plastic bag. ? Place a towel between your skin and the bag. ? Leave the ice on for 20 minutes, 2-3 times a day. General instructions  Take over-the-counter and prescription medicines only as told by your doctor. ? Medicated creams and medicines may be used as told.  Exercise often. Ask your doctor how much and what kind of exercise is best for you.  Go to the bathroom when you have the urge to poop. Do not wait.  Avoid pushing too hard when you poop.  Keep your butt dry and clean. Use wet toilet paper or moist towelettes after pooping.  Do not sit on the toilet for a long time.  Keep all follow-up visits as told by your doctor. This is important. Contact a doctor if you:  Have pain and swelling that do not get better with treatment or medicine.  Have trouble pooping.  Cannot poop.  Have pain or swelling outside the area of the hemorrhoids. Get help right away if you have:  Bleeding that will not stop. Summary  Hemorrhoids are swollen veins in the butt or around the opening of the butt.  They can cause pain, itching, or bleeding.  Eat foods that have a lot of fiber in them. These include whole grains, beans, nuts, fruits, and vegetables.  Take a warm-water bath (sitz bath) for 20 minutes to ease pain. Do this 3-4 times a day. This information is not intended to replace advice given to you by your health care provider. Make sure you discuss any questions you have with your health care provider. Document Revised: 01/29/2018 Document Reviewed: 06/12/2017 Elsevier Patient Education  2020 [employer].

## 2019-09-08 NOTE — Telephone Encounter (Signed)
Last ov- 09/07/19 with [facility] Dec for rectal bleeding  Last refill- 03/02/2019

## 2019-09-08 NOTE — Progress Notes (Signed)
Hi Andrea Esparza,   Your labs came back and everything looks very good. No signs of anemia, which is great and tells me that you are not losing large amounts of blood that would indicate something like a GI bleed. Your iron levels and iron stores are very good, also.   Has the medication helped at all?  Let me know if you have any questions.  SaraBeth

## 2019-09-10 ENCOUNTER — Encounter: Payer: Self-pay | Admitting: Nurse Practitioner

## 2019-09-10 DIAGNOSIS — K602 Anal fissure, unspecified: Secondary | ICD-10-CM | POA: Insufficient documentation

## 2019-09-14 ENCOUNTER — Ambulatory Visit (HOSPITAL_COMMUNITY): Payer: No Typology Code available for payment source | Admitting: Licensed Clinical Social Worker

## 2019-10-15 ENCOUNTER — Ambulatory Visit (HOSPITAL_COMMUNITY): Payer: No Typology Code available for payment source | Admitting: Licensed Clinical Social Worker

## 2019-10-25 ENCOUNTER — Encounter: Payer: Self-pay | Admitting: Nurse Practitioner

## 2020-03-17 ENCOUNTER — Other Ambulatory Visit: Payer: Self-pay | Admitting: Osteopathic Medicine

## 2020-03-17 DIAGNOSIS — F419 Anxiety disorder, unspecified: Secondary | ICD-10-CM

## 2020-06-19 ENCOUNTER — Other Ambulatory Visit: Payer: Self-pay | Admitting: Osteopathic Medicine

## 2020-06-19 DIAGNOSIS — F419 Anxiety disorder, unspecified: Secondary | ICD-10-CM

## 2020-07-21 ENCOUNTER — Other Ambulatory Visit: Payer: Self-pay | Admitting: Osteopathic Medicine

## 2020-07-21 DIAGNOSIS — F419 Anxiety disorder, unspecified: Secondary | ICD-10-CM

## 2020-08-02 ENCOUNTER — Other Ambulatory Visit (HOSPITAL_COMMUNITY)
Admission: RE | Admit: 2020-08-02 | Discharge: 2020-08-02 | Disposition: A | Discharge status: Home / Self Care | Payer: No Typology Code available for payment source | Source: Ambulatory Visit | Attending: Osteopathic Medicine | Admitting: Osteopathic Medicine

## 2020-08-02 ENCOUNTER — Ambulatory Visit: Payer: No Typology Code available for payment source | Admitting: Osteopathic Medicine

## 2020-08-02 ENCOUNTER — Other Ambulatory Visit: Payer: Self-pay

## 2020-08-02 ENCOUNTER — Other Ambulatory Visit: Payer: Self-pay | Admitting: Osteopathic Medicine

## 2020-08-02 ENCOUNTER — Encounter: Payer: Self-pay | Admitting: Osteopathic Medicine

## 2020-08-02 VITALS — BP 120/73 | HR 86 | Temp 98.5°F | Wt 223.1 lb

## 2020-08-02 DIAGNOSIS — Z124 Encounter for screening for malignant neoplasm of cervix: Secondary | ICD-10-CM

## 2020-08-02 DIAGNOSIS — F411 Generalized anxiety disorder: Secondary | ICD-10-CM

## 2020-08-02 DIAGNOSIS — K602 Anal fissure, unspecified: Secondary | ICD-10-CM | POA: Diagnosis not present

## 2020-08-02 DIAGNOSIS — F329 Major depressive disorder, single episode, unspecified: Secondary | ICD-10-CM

## 2020-08-02 DIAGNOSIS — F418 Other specified anxiety disorders: Secondary | ICD-10-CM | POA: Diagnosis not present

## 2020-08-02 DIAGNOSIS — E559 Vitamin D deficiency, unspecified: Secondary | ICD-10-CM

## 2020-08-02 DIAGNOSIS — Z0000 Encounter for general adult medical examination without abnormal findings: Secondary | ICD-10-CM

## 2020-08-02 DIAGNOSIS — I493 Ventricular premature depolarization: Secondary | ICD-10-CM

## 2020-08-02 DIAGNOSIS — F419 Anxiety disorder, unspecified: Secondary | ICD-10-CM

## 2020-08-02 MED ORDER — NIFEDIPINE MICRONIZED POWD: 1 refills | Status: DC

## 2020-08-02 MED ORDER — SERTRALINE HCL 50 MG PO TABS: ORAL_TABLET | ORAL | 3 refills | Status: DC

## 2020-08-02 NOTE — Progress Notes (Signed)
Andrea Esparza is a 30 y.o. female who presents to  [facility] at [facility]  today, 08/02/20, seeking care for the following:  Annual preventive visit Pap due Concern for rectal pain, recurrence of anal fissure Refill medications      ASSESSMENT & PLAN with other pertinent findings:  The primary encounter diagnosis was Annual physical exam. Diagnoses of Anal fissure, Anxious depression, Generalized anxiety disorder, Major depression, chronic, PVC (premature ventricular contraction), Vitamin D deficiency, Cervical cancer screening, and Anxiety disorder, unspecified type were also pertinent to this visit.  1. Annual physical exam See below  2. Anal fissure Nifedipine sent Pt to contact GI if this isn't helping   3. Anxious depression 4. Generalized anxiety disorder 5. Major depression, chronic Refilled Zoloft, stable on this   6. PVC (premature ventricular contraction) No issues  7. Vitamin D deficiency Labs pending  8. Cervical cancer screening Pap pending      Patient Instructions  General Preventive Care Most recent routine screening labs: ordered today.  Blood pressure goal 130/80 or less.  Tobacco: don't!  Alcohol: if you have concerns about your alcohol intake, please talk to me!  Exercise: as tolerated to reduce risk of cardiovascular disease and diabetes. Strength training will also prevent osteoporosis.  Mental health: if need for mental health care (medicines, counseling, other), or concerns about moods, please let me know!  Sexual / Reproductive health: if need for STD testing, or if concerns with libido/pain problems, please let me know! If you need to discuss family planning, please let me know! Advanced Directive: Living Will and/or Healthcare Power of Attorney recommended for all adults, regardless of age or health.  Vaccines Flu vaccine: for almost everyone, every fall.  Shingles vaccine: after age 16.   Pneumonia vaccines: after age 32. Tetanus booster: every 10 years (due 2026) / booster 3rd trimester of pregnancy HPV vaccine: Gardasil all done!  COVID vaccine: THANKS for getting your vaccine! :) BOOSTER(S) STRONGLY RECOMMENDED  Cancer screenings  Colon cancer screening: for everyone age 29-75.  Breast cancer screening: mammogram at age 81.  Cervical cancer screening: Pap every 5 years if normal.  Lung cancer screening: not needed as long as you don't take up smoking!  Infection screenings  HIV: recommended screening at least once age 60-65, more often as needed. Gonorrhea/Chlamydia, other STI: screening as needed Hepatitis C: recommended once for everyone age 32-75 TB: certain at-risk populations, or depending on work requirements and/or travel history Other Bone Density Test: recommended for women at age 40  Orders Placed This Encounter  Procedures   CBC   COMPLETE METABOLIC PANEL WITH GFR   Lipid panel   VITAMIN D 25 Hydroxy (Vit-D Deficiency, Fractures)   TSH    Meds ordered this encounter  Medications   NIFEdipine Micronized POWD    Sig: Topical nifedipine applied two to four times daily as a 0.2 to 0.3% ointment, Dx anal fissue    Dispense:  25 g    Refill:  1   sertraline (ZOLOFT) 50 MG tablet    Sig: TAKE 1 TABLET BY MOUTH DAILY    Dispense:  90 tablet    Refill:  3    No refills. Pt is overdue for a f/u appt with provider.     See below for relevant physical exam findings  See below for recent lab and imaging results reviewed  Medications, allergies, PMH, PSH, SocH, FamH reviewed below    Follow-up instructions: Return in  about 1 year (around 08/02/2021) for [place] (call week prior to visit for lab orders).                                        Exam:  BP 120/73 (BP Location: Left Arm, Patient Position: Sitting, Cuff Size: Large)   Pulse 86   Temp 98.5 F (36.9 C) (Oral)   Wt 223 lb 1.3 oz (101.2 kg)   BMI 36.01  kg/m  Constitutional: VS see above. General Appearance: alert, well-developed, well-nourished, NAD Neck: No masses, trachea midline.  Respiratory: Normal respiratory effort. no wheeze, no rhonchi, no rales Cardiovascular: S1/S2 normal, no murmur, no rub/gallop auscultated. RRR.  Musculoskeletal: Gait normal. Symmetric and independent movement of all extremities Abdominal: non-tender, non-distended, no appreciable organomegaly, neg Murphy's, BS WNLx4 Neurological: Normal balance/coordination. No tremor. Skin: warm, dry, intact.  Psychiatric: Normal judgment/insight. Normal mood and affect. Oriented x3.  GYN: No lesions/ulcers to external genitalia, normal urethra, normal vaginal mucosa, physiologic discharge, cervix normal without lesions, uterus not enlarged or tender, adnexa no masses and nontender. IUD strings visible    Current Meds  Medication Sig   cholecalciferol (VITAMIN D3) 25 MCG (1000 UNIT) tablet Take 1,000 Units by mouth daily.   hydrocortisone (ANUSOL-HC) 25 MG suppository Place 1 suppository (25 mg total) rectally 2 (two) times daily as needed for hemorrhoids.   levonorgestrel (MIRENA) 20 MCG/24HR IUD 1 each by Intrauterine route once.   lidocaine (XYLOCAINE) 5 % ointment Apply 1 application topically 4 (four) times daily as needed.   NIFEdipine Micronized POWD Topical nifedipine applied two to four times daily as a 0.2 to 0.3% ointment, Dx anal fissue   [DISCONTINUED] sertraline (ZOLOFT) 50 MG tablet TAKE 1 TABLET BY MOUTH EVERYDAY AT BEDTIME    Allergies  Allergen Reactions   Penicillins Other (See Comments) and Hives    In childhood     Patient Active Problem List   Diagnosis Date Noted   Anal fissure 09/10/2019   Colicky abdominal pain 01/12/2019   PVC (premature ventricular contraction) 01/12/2019   Vitamin D deficiency 01/12/2019   Nicotine dependence due to vaping non-tobacco product 01/12/2019   Anxious depression 09/16/2018   History of alcoholism (HCC)  09/16/2018   Major depression, chronic 04/27/2015   Generalized anxiety disorder 04/27/2015   Tobacco abuse 04/27/2015   IUD (intrauterine device) in place 01/04/2015    Family History  Problem Relation Age of Onset   Depression Father    Alcohol abuse Maternal Aunt    Alcohol abuse Maternal Uncle    Cancer Maternal Grandfather    Diabetes Paternal Grandmother    Alcohol abuse Paternal Grandfather    Heart attack Paternal Grandfather     Social History   Tobacco Use  Smoking Status Former   Packs/day: 1.00   Years: 14.00   Pack years: 14.00   Types: E-cigarettes, Cigarettes   Quit date: 11/05/2018   Years since quitting: 1.7  Smokeless Tobacco Never    Past Surgical History:  Procedure Laterality Date   MOUTH SURGERY  2004/2005    Immunization History  Administered Date(s) Administered   HPV Quadrivalent 06/20/2005, 12/12/2005, 12/19/2008   Influenza, Seasonal, Injecte, Preservative Fre 11/26/2012   PFIZER(Purple Top)SARS-COV-2 Vaccination 05/05/2019, 05/26/2019   Pneumococcal-Unspecified 05/01/2010   Td 06/20/2004   Tdap 10/13/2014    No results found for this or any previous visit (from the past 2160 hour(s)).  No results found.     All questions at time of visit were answered - patient instructed to contact office with any additional concerns or updates. ER/RTC precautions were reviewed with the patient as applicable.   Please note: manual typing as well as voice recognition software may have been used to produce this document - typos may escape review. Please contact Dr. Sheppard Coil for any needed clarifications.

## 2020-08-02 NOTE — Patient Instructions (Addendum)
General Preventive Care Most recent routine screening labs: ordered today.  Blood pressure goal 130/80 or less.  Tobacco: don't!  Alcohol: if you have concerns about your alcohol intake, please talk to me!  Exercise: as tolerated to reduce risk of cardiovascular disease and diabetes. Strength training will also prevent osteoporosis.  Mental health: if need for mental health care (medicines, counseling, other), or concerns about moods, please let me know!  Sexual / Reproductive health: if need for STD testing, or if concerns with libido/pain problems, please let me know! If you need to discuss family planning, please let me know! Advanced Directive: Living Will and/or Healthcare Power of Attorney recommended for all adults, regardless of age or health.  Vaccines Flu vaccine: for almost everyone, every fall.  Shingles vaccine: after age 32.  Pneumonia vaccines: after age 20. Tetanus booster: every 10 years (due 2026) / booster 3rd trimester of pregnancy HPV vaccine: Gardasil all done!  COVID vaccine: THANKS for getting your vaccine! :) BOOSTER(S) STRONGLY RECOMMENDED  Cancer screenings  Colon cancer screening: for everyone age 63-75.  Breast cancer screening: mammogram at age 46.  Cervical cancer screening: Pap every 5 years if normal.  Lung cancer screening: not needed as long as you don't take up smoking!  Infection screenings  HIV: recommended screening at least once age 2-65, more often as needed. Gonorrhea/Chlamydia, other STI: screening as needed Hepatitis C: recommended once for everyone age 80-75 TB: certain at-risk populations, or depending on work requirements and/or travel history Other Bone Density Test: recommended for women at age 64

## 2020-08-02 NOTE — Telephone Encounter (Signed)
Per [facility] - THIS IS A COMPOUNDED PRODUCT. WOULD  NEED TO BE SENT TO [facility].

## 2020-08-02 NOTE — Telephone Encounter (Signed)
Pt moved to [place], [place], can they advise on a compounding pharmacy in that area? Thanks!

## 2020-08-03 LAB — COMPLETE METABOLIC PANEL WITH GFR
AG Ratio: 1.8 (calc) (ref 1.0–2.5)
ALT: 12 U/L (ref 6–29)
AST: 14 U/L (ref 10–30)
Albumin: 4.4 g/dL (ref 3.6–5.1)
Alkaline phosphatase (APISO): 63 U/L (ref 31–125)
BUN: 11 mg/dL (ref 7–25)
CO2: 26 mmol/L (ref 20–32)
Calcium: 9.1 mg/dL (ref 8.6–10.2)
Chloride: 106 mmol/L (ref 98–110)
Creat: 0.82 mg/dL (ref 0.50–1.10)
GFR, Est African American: 110 mL/min/{1.73_m2} (ref >=60)
GFR, Est Non African American: 95 mL/min/{1.73_m2} (ref >=60)
Globulin: 2.4 g/dL (calc) (ref 1.9–3.7)
Glucose, Bld: 90 mg/dL (ref 65–99)
Potassium: 4 mmol/L (ref 3.5–5.3)
Sodium: 138 mmol/L (ref 135–146)
Total Bilirubin: 0.3 mg/dL (ref 0.2–1.2)
Total Protein: 6.8 g/dL (ref 6.1–8.1)

## 2020-08-03 LAB — VITAMIN D 25 HYDROXY (VIT D DEFICIENCY, FRACTURES): Vit D, 25-Hydroxy: 17 ng/mL — ABNORMAL LOW (ref 30–100)

## 2020-08-03 LAB — CBC
HCT: 43.8 % (ref 35.0–45.0)
Hemoglobin: 14.3 g/dL (ref 11.7–15.5)
MCH: 30.2 pg (ref 27.0–33.0)
MCHC: 32.6 g/dL (ref 32.0–36.0)
MCV: 92.4 fL (ref 80.0–100.0)
MPV: 9.7 fL (ref 7.5–12.5)
Platelets: 271 10*3/uL (ref 140–400)
RBC: 4.74 10*6/uL (ref 3.80–5.10)
RDW: 12.2 % (ref 11.0–15.0)
WBC: 10.7 10*3/uL (ref 3.8–10.8)

## 2020-08-03 LAB — LIPID PANEL
Cholesterol: 226 mg/dL — ABNORMAL HIGH (ref <200)
HDL: 37 mg/dL — ABNORMAL LOW (ref >=50)
LDL Cholesterol (Calc): 149 mg/dL (calc) — ABNORMAL HIGH
Non-HDL Cholesterol (Calc): 189 mg/dL (calc) — ABNORMAL HIGH (ref <130)
Total CHOL/HDL Ratio: 6.1 (calc) — ABNORMAL HIGH (ref <5.0)
Triglycerides: 257 mg/dL — ABNORMAL HIGH (ref <150)

## 2020-08-03 LAB — TSH: TSH: 2.39 mIU/L

## 2020-08-03 NOTE — Telephone Encounter (Signed)
Per pt, this is the closest compounding pharmacy for patient.

## 2020-08-04 LAB — CYTOLOGY - PAP
Adequacy: ABSENT
Comment: NEGATIVE
Diagnosis: NEGATIVE
High risk HPV: NEGATIVE

## 2020-08-10 ENCOUNTER — Encounter: Payer: Self-pay | Admitting: Osteopathic Medicine

## 2020-08-10 MED ORDER — NIFEDIPINE MICRONIZED POWD: 1 refills | Status: DC

## 2020-09-11 ENCOUNTER — Other Ambulatory Visit: Payer: Self-pay | Admitting: Osteopathic Medicine

## 2020-09-11 ENCOUNTER — Other Ambulatory Visit: Payer: Self-pay

## 2020-09-11 MED ORDER — NIFEDIPINE MICRONIZED POWD: 1 refills | Status: DC

## 2020-09-12 ENCOUNTER — Other Ambulatory Visit: Payer: Self-pay

## 2020-09-12 MED ORDER — NIFEDIPINE MICRONIZED POWD
1 refills | Status: DC
Start: 2020-09-12 — End: 2020-09-12

## 2020-09-12 MED ORDER — NIFEDIPINE MICRONIZED POWD: 1 refills | Status: DC

## 2021-02-24 IMAGING — CT CT ABD-PELV W/ CM
2 of 4 series · 16 of 46 positions shown, 18 images · IV contrast (APPLIED)
Comparison: None.

CLINICAL DATA: Abdominal pain. Left-sided abdominal pain x2 months.

EXAM:
CT ABDOMEN AND PELVIS WITH CONTRAST
TECHNIQUE: Multidetector CT imaging of the abdomen and pelvis was performed
using the standard protocol following bolus administration of
intravenous contrast.
CONTRAST:  100mL OMNIPAQUE IOHEXOL 300 MG/ML  SOLN

[Series 2: axial st · axial · 0.98mm/px · z∈[-497,-17]mm · 13 of 106 slices shown, 15 images]
[im 5/106  soft-tissue]
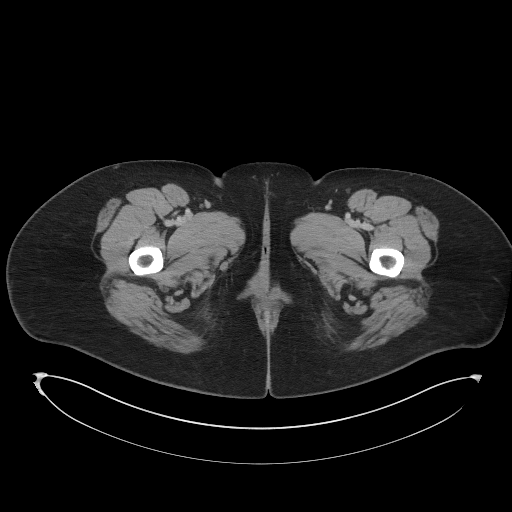
[im 5/106  bone]
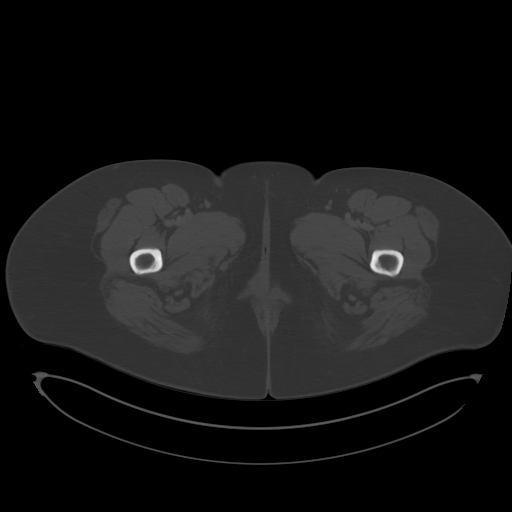
[im 14/106  soft-tissue]
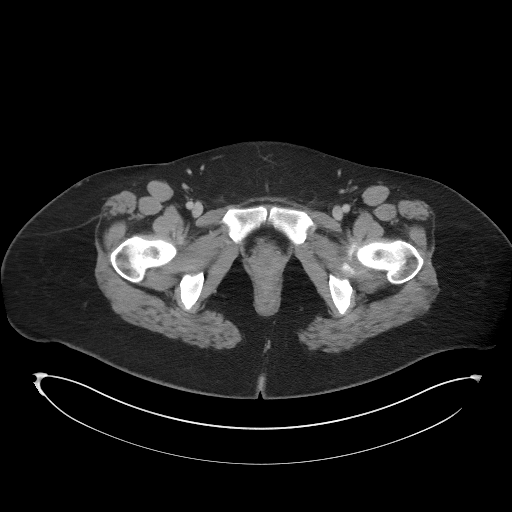
[im 23/106  soft-tissue]
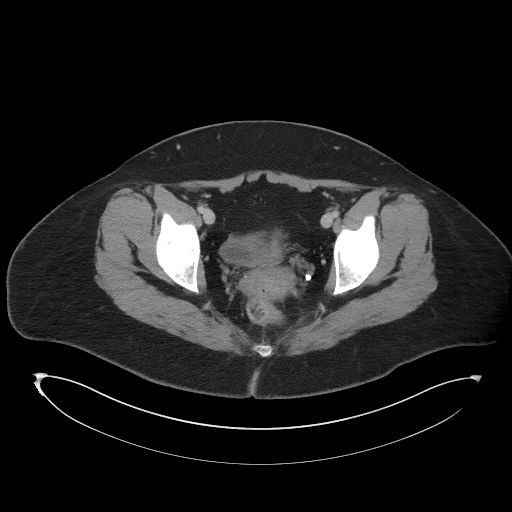
[im 28/106  soft-tissue]
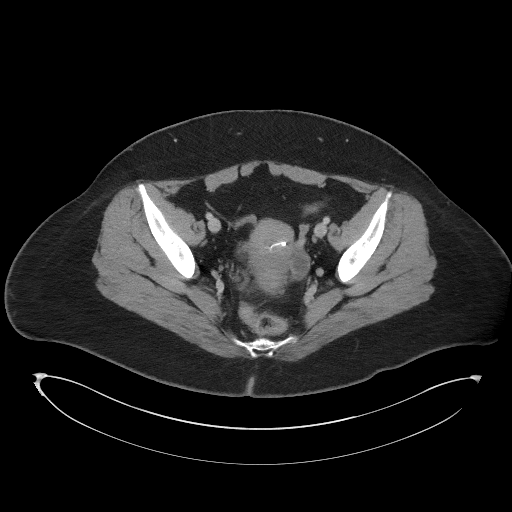
[im 37/106  soft-tissue]
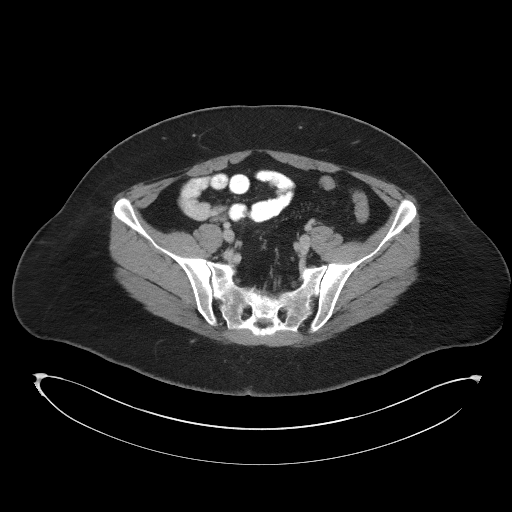
[im 46/106  soft-tissue]
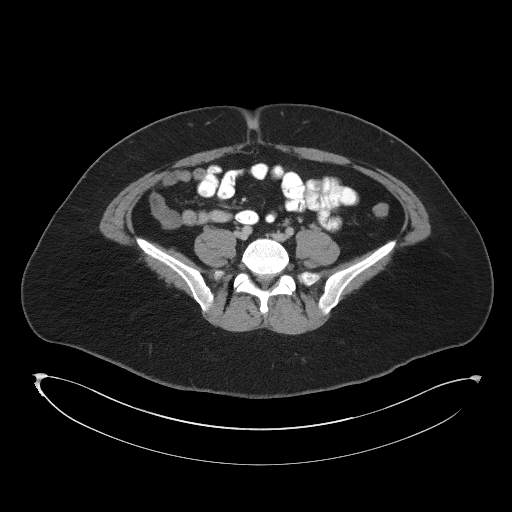
[im 55/106  soft-tissue]
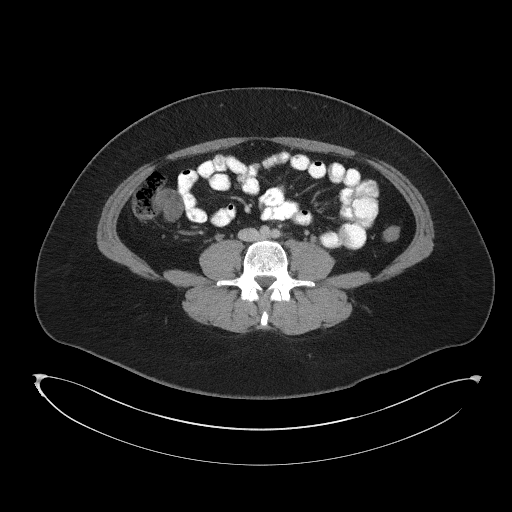
[im 60/106  soft-tissue]
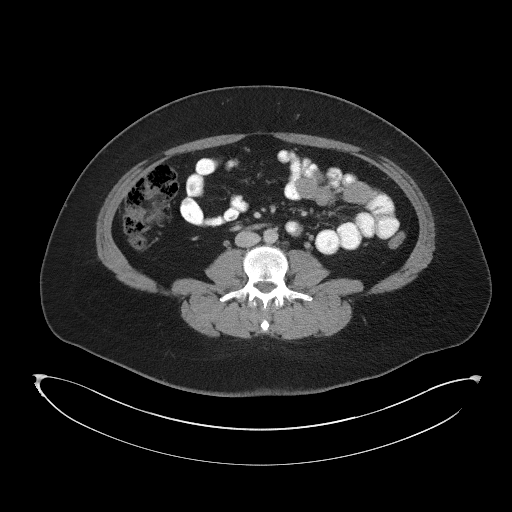
[im 69/106  soft-tissue]
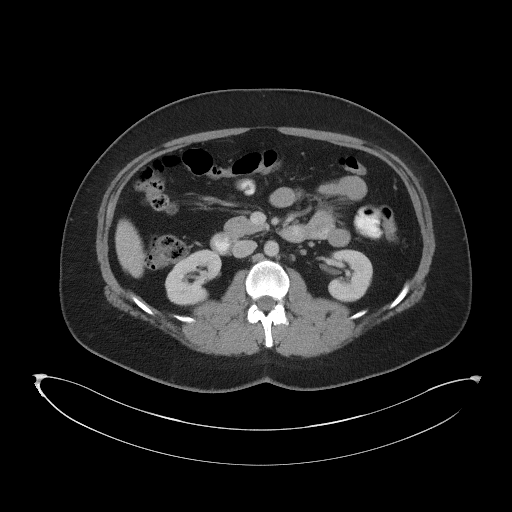
[im 69/106  bone]
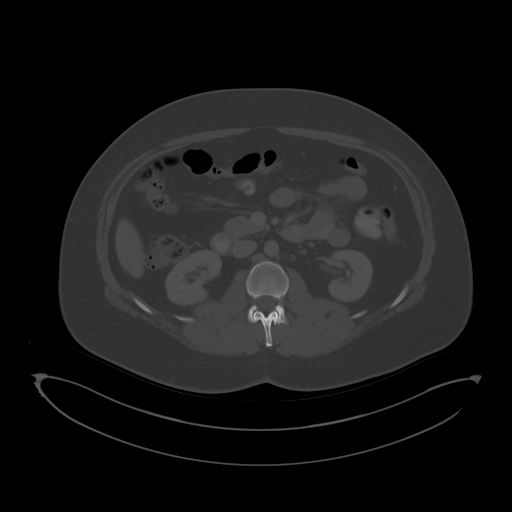
[im 78/106  soft-tissue]
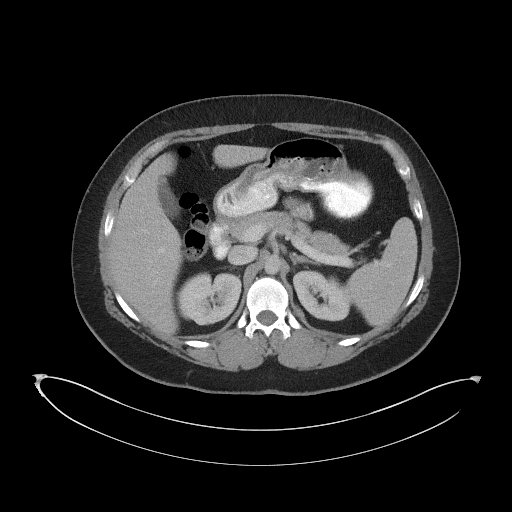
[im 83/106  soft-tissue]
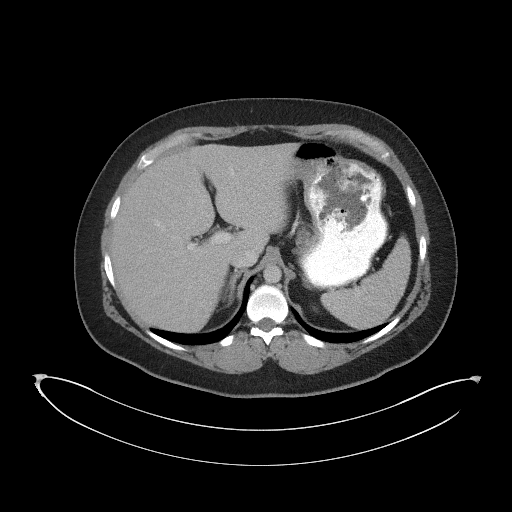
[im 92/106  soft-tissue]
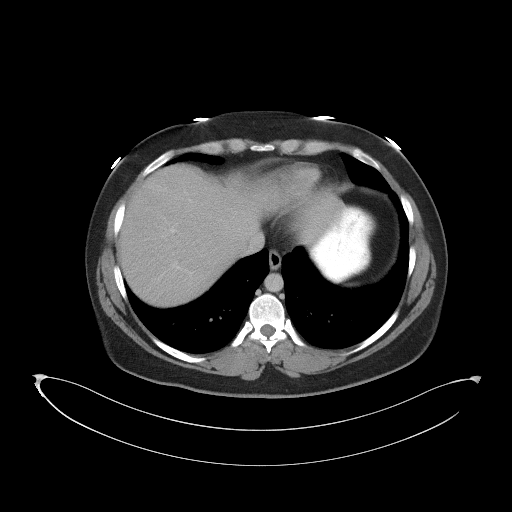
[im 101/106  soft-tissue]
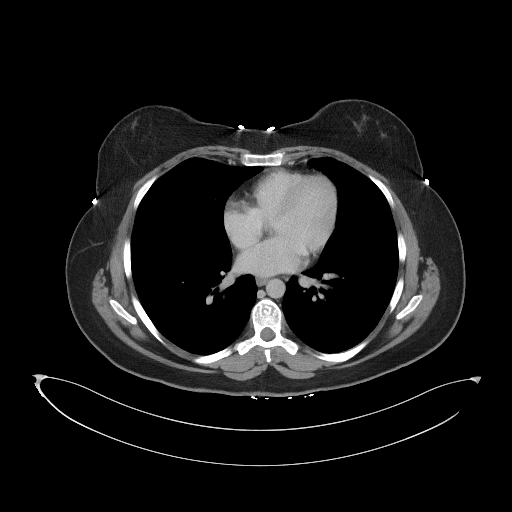

[Series 5: coronal st · coronal · 0.92mm/px · 3 of 102 slices shown]
[im 34/102  soft-tissue]
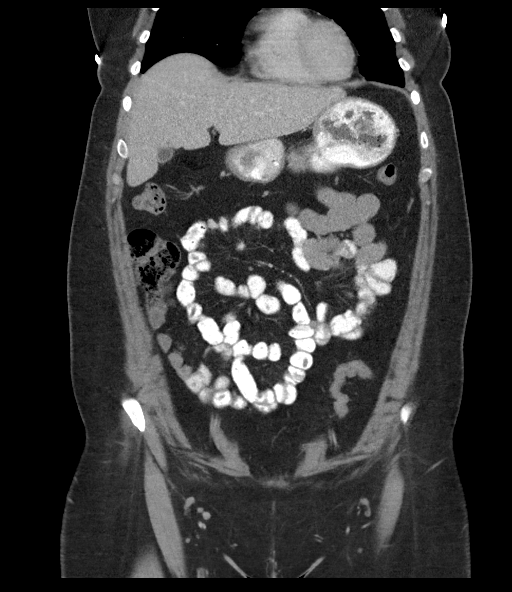
[im 45/102  soft-tissue]
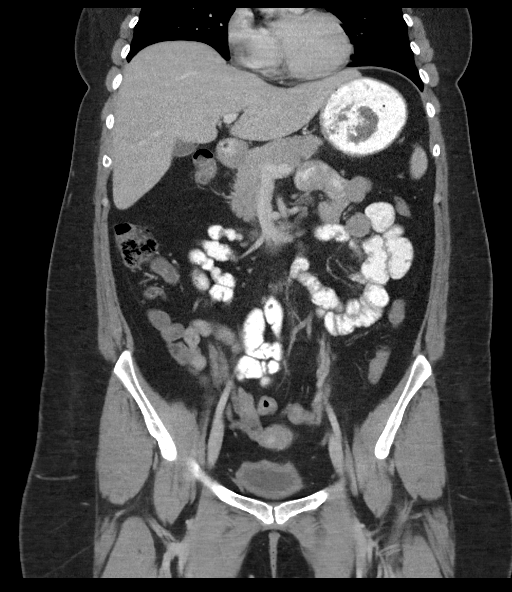
[im 57/102  soft-tissue]
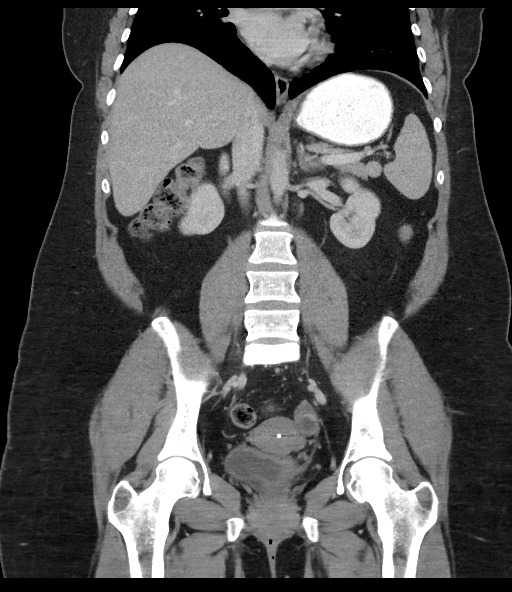

[16 of 46 positions shown; findings below may reference images not displayed]

FINDINGS: Lower chest: The lung bases are clear. The heart size is normal.

Hepatobiliary: The liver is normal. Normal gallbladder.There is no
biliary ductal dilation.

Pancreas: Normal contours without ductal dilatation. No
peripancreatic fluid collection.

Spleen: No splenic laceration or hematoma.

Adrenals/Urinary Tract:

--Adrenal glands: No adrenal hemorrhage.

--Right kidney/ureter: No hydronephrosis or perinephric hematoma.

--Left kidney/ureter: No hydronephrosis or perinephric hematoma.

--Urinary bladder: Unremarkable.

Stomach/Bowel:

--Stomach/Duodenum: No hiatal hernia or other gastric abnormality.
Normal duodenal course and caliber.

--Small bowel: No dilatation or inflammation.

--Colon: No focal abnormality.

--Appendix: Normal.

Vascular/Lymphatic: Normal course and caliber of the major abdominal
vessels.

--No retroperitoneal lymphadenopathy.

--No mesenteric lymphadenopathy.

--No pelvic or inguinal lymphadenopathy.

Reproductive: There is an IUD in place. There is a left ovarian
cystic structure measuring approximately 2.4 cm. No further
follow-up is required for this finding.

Other: No ascites or free air. The abdominal wall is normal.

Musculoskeletal. No acute displaced fractures.
IMPRESSION: No acute abnormality. No findings to explain the patient's
left-sided abdominal pain.

## 2021-08-13 ENCOUNTER — Other Ambulatory Visit: Payer: Self-pay | Admitting: Osteopathic Medicine

## 2021-08-13 DIAGNOSIS — F419 Anxiety disorder, unspecified: Secondary | ICD-10-CM

## 2021-09-13 ENCOUNTER — Encounter: Payer: Self-pay | Admitting: Family Medicine

## 2021-09-13 ENCOUNTER — Other Ambulatory Visit: Payer: Self-pay | Admitting: Family Medicine

## 2021-09-13 ENCOUNTER — Ambulatory Visit: Payer: No Typology Code available for payment source | Admitting: Family Medicine

## 2021-09-13 VITALS — BP 103/70 | HR 97 | Ht 66.0 in | Wt 221.0 lb

## 2021-09-13 DIAGNOSIS — F329 Major depressive disorder, single episode, unspecified: Secondary | ICD-10-CM | POA: Diagnosis not present

## 2021-09-13 DIAGNOSIS — R21 Rash and other nonspecific skin eruption: Secondary | ICD-10-CM | POA: Diagnosis not present

## 2021-09-13 DIAGNOSIS — L989 Disorder of the skin and subcutaneous tissue, unspecified: Secondary | ICD-10-CM | POA: Diagnosis not present

## 2021-09-13 DIAGNOSIS — F411 Generalized anxiety disorder: Secondary | ICD-10-CM

## 2021-09-13 MED ORDER — TRIAMCINOLONE ACETONIDE 0.1 % EX CREA: 1.0000 | TOPICAL_CREAM | Freq: Two times a day (BID) | CUTANEOUS | 0 refills | Status: DC

## 2021-09-13 MED ORDER — SERTRALINE HCL 50 MG PO TABS: ORAL_TABLET | ORAL | 0 refills | Status: DC

## 2021-09-13 NOTE — Assessment & Plan Note (Signed)
-   increase in zoloft to see if this helps her symptoms

## 2021-09-13 NOTE — Assessment & Plan Note (Signed)
-  increase zoloft to 75mg  to see if we can improve pt's anxiety at night - she said she and her husband are thinking about having children and we discussed that zoloft has been studied the most and is safe in pregnancy.

## 2021-09-13 NOTE — Progress Notes (Signed)
Acute Office Visit  Subjective:     Patient ID: GARIELLE MROZ, female    DOB: 05/21/1990, 31 y.o.   MRN: [id number]  Chief Complaint  Patient presents with   Medication Refill    HPI Patient is in today for anxiety and depression management. She is currently on zoloft 50mg  and she reports doing well with this but feels she can be doing better. She is interested in trying to increase her dosage to see if this improves her worrying at night.    Bump on right leg - has been present for 10 years but has grown in the last few months - denies bleeding does say she hit it when shaving  Ear Flaking  - b/l ears  - says it is located in the inner ear pinna and front of the ear and says the skin flakes  - does admit to pain  - has tried lotion and says it improves when putting on ear.  - admits to pruritis  - no one in the house has it  - no drandruff.   Review of Systems  Constitutional:  Negative for chills and fever.  HENT:         Ear flaking  Respiratory:  Negative for cough and shortness of breath.   Cardiovascular:  Negative for chest pain.  Neurological:  Negative for headaches.        Objective:    BP 103/70   Pulse 97   Ht 5\' 6"  (1.676 m)   Wt 221 lb (100.2 kg)   SpO2 96%   BMI 35.67 kg/m    Physical Exam Vitals and nursing note reviewed.  Constitutional:      General: She is not in acute distress.    Appearance: Normal appearance.  HENT:     Head: Normocephalic and atraumatic.     Right Ear: Tympanic membrane, ear canal and external ear normal.     Left Ear: Tympanic membrane, ear canal and external ear normal.     Ears:     Comments: Rash in ears bilaterally. Wood's lamp doesn't show any green/blue    Nose: Nose normal.  Eyes:     Conjunctiva/sclera: Conjunctivae normal.  Cardiovascular:     Rate and Rhythm: Normal rate and regular rhythm.  Pulmonary:     Effort: Pulmonary effort is normal.     Breath sounds: Normal breath sounds.   Neurological:     General: No focal deficit present.     Mental Status: She is alert and oriented to person, place, and time.  Psychiatric:        Mood and Affect: Mood normal.        Behavior: Behavior normal.        Thought Content: Thought content normal.        Judgment: Judgment normal.   SHAVE BIOPSY PROCEDURE NOTE  We discussed the indications for biopsy and some of the risks to include: bleeding, infection, scarring, and need for re-biopsy. I answered any questions which the patient had.   We then performed a timeout, confirming the patient's name, procedure to be performed, and correct site.  Following the discussion above, and upon receipt of verbal informed consent, I confirmed and marked the area(s), photographed it (them), cleansed the area(s) with chlorhexidine, and anesthetized it (them) with lidocaine. I then used a gillette blade to obtain a specimen(s), which was (were) sent in formalin to the lab. Hemostasis was obtained using direct pressure, topical aluminum chloride. There  were no complications. Wound care discussed.  We will contact the patient by telephone when the results are available. Otherwise, I recommended the patient call the clinic to inquire about the results if they do not hear from [place] after 14 days.  No results found for any visits on 09/13/21.     Assessment & Plan:   Problem List Items Addressed This Visit       Musculoskeletal and Integument   Rash    - rash in ears with erythema and some skin breakdown. No purulent discharge or crusting. TM normal  - wood's lamp was non-specific  - will start trial of triamcinolone cream to see if this improves. If not will consider referral to derm       Relevant Medications   triamcinolone cream (KENALOG) 0.1 %   Other Relevant Orders   Dermatology pathology   Skin lesion    - lesion located on right lower extremity  - biopsy taken of lesion  - told pt we will follow up once pathology report comes  back        Other   Major depression, chronic    - increase in zoloft to see if this helps her symptoms      Relevant Medications   sertraline (ZOLOFT) 50 MG tablet   Generalized anxiety disorder - Primary    -increase zoloft to 75mg  to see if we can improve pt's anxiety at night - she said she and her husband are thinking about having children and we discussed that zoloft has been studied the most and is safe in pregnancy.      Relevant Medications   sertraline (ZOLOFT) 50 MG tablet    Meds ordered this encounter  Medications   sertraline (ZOLOFT) 50 MG tablet    Sig: TAKE 1 1/2 TABLET BY MOUTH EVERY NIGHT    Dispense:  90 tablet    Refill:  0   triamcinolone cream (KENALOG) 0.1 %    Sig: Apply 1 Application topically 2 (two) times daily.    Dispense:  30 g    Refill:  0    Return in about 3 months (around 12/14/2021).  13/11/2021, DO

## 2021-09-13 NOTE — Assessment & Plan Note (Signed)
-   rash in ears with erythema and some skin breakdown. No purulent discharge or crusting. TM normal  - wood's lamp was non-specific  - will start trial of triamcinolone cream to see if this improves. If not will consider referral to derm

## 2021-09-13 NOTE — Assessment & Plan Note (Signed)
-   lesion located on right lower extremity  - biopsy taken of lesion  - told pt we will follow up once pathology report comes back

## 2021-09-17 ENCOUNTER — Other Ambulatory Visit: Payer: Self-pay | Admitting: Family Medicine

## 2021-09-17 ENCOUNTER — Encounter: Payer: Self-pay | Admitting: Family Medicine

## 2021-09-17 DIAGNOSIS — F329 Major depressive disorder, single episode, unspecified: Secondary | ICD-10-CM

## 2021-09-17 DIAGNOSIS — F411 Generalized anxiety disorder: Secondary | ICD-10-CM

## 2021-09-17 MED ORDER — SERTRALINE HCL 50 MG PO TABS: ORAL_TABLET | ORAL | 0 refills | Status: DC

## 2021-09-17 NOTE — Progress Notes (Signed)
Pt said med was not refilled. Resent med to pharmacy

## 2021-09-18 MED ORDER — SERTRALINE HCL 50 MG PO TABS: ORAL_TABLET | ORAL | 0 refills | Status: DC

## 2021-09-27 ENCOUNTER — Encounter: Payer: No Typology Code available for payment source | Admitting: Obstetrics and Gynecology

## 2021-11-22 ENCOUNTER — Encounter: Payer: Self-pay | Admitting: Family Medicine

## 2021-12-13 ENCOUNTER — Other Ambulatory Visit: Payer: Self-pay | Admitting: Family Medicine

## 2021-12-13 DIAGNOSIS — F411 Generalized anxiety disorder: Secondary | ICD-10-CM

## 2021-12-13 DIAGNOSIS — F329 Major depressive disorder, single episode, unspecified: Secondary | ICD-10-CM

## 2021-12-14 ENCOUNTER — Encounter: Payer: Self-pay | Admitting: Family Medicine

## 2021-12-19 ENCOUNTER — Telehealth: Payer: Self-pay | Admitting: Family Medicine

## 2021-12-19 NOTE — Telephone Encounter (Signed)
Patient called in regards to Sertraline she says 2 rxs where sent over to the pharmacy one is for 45 days the other 30 days and they will not fill it until order is clarified

## 2021-12-21 ENCOUNTER — Other Ambulatory Visit: Payer: Self-pay | Admitting: Family Medicine

## 2021-12-21 DIAGNOSIS — F411 Generalized anxiety disorder: Secondary | ICD-10-CM

## 2021-12-21 DIAGNOSIS — F329 Major depressive disorder, single episode, unspecified: Secondary | ICD-10-CM

## 2021-12-21 MED ORDER — SERTRALINE HCL 50 MG PO TABS: ORAL_TABLET | ORAL | 2 refills | Status: DC

## 2021-12-21 NOTE — Progress Notes (Signed)
Pt needed 90 day supply per pt insurance requirement. Have changed medications per protocol

## 2021-12-21 NOTE — Telephone Encounter (Signed)
Spoke with patient, she states that her insurance requires a 90 days fill for recurring prescriptions. They will not pay for a lesser supply. She needs a 90 supply sent in. Patient is scheduled for 11/27 with [facility].

## 2021-12-31 ENCOUNTER — Ambulatory Visit: Payer: No Typology Code available for payment source | Admitting: Family Medicine

## 2021-12-31 ENCOUNTER — Encounter: Payer: Self-pay | Admitting: Family Medicine

## 2021-12-31 VITALS — BP 111/59 | HR 74 | Ht 66.0 in | Wt 230.0 lb

## 2021-12-31 DIAGNOSIS — Z23 Encounter for immunization: Secondary | ICD-10-CM | POA: Diagnosis not present

## 2021-12-31 DIAGNOSIS — F411 Generalized anxiety disorder: Secondary | ICD-10-CM | POA: Diagnosis not present

## 2021-12-31 DIAGNOSIS — F329 Major depressive disorder, single episode, unspecified: Secondary | ICD-10-CM | POA: Diagnosis not present

## 2021-12-31 NOTE — Assessment & Plan Note (Signed)
-   pt doing well on 75mg  zoloft, will continue - GAD7: 3

## 2021-12-31 NOTE — Assessment & Plan Note (Addendum)
-   continue zoloft 75mg  daily  - pt doing very well  - PHQ9: 2 - denies homicidal and suicidal ideation

## 2021-12-31 NOTE — Progress Notes (Signed)
Established patient visit   Patient: Andrea Esparza   DOB: 1990/03/04   31 y.o. Female  MRN: [id number] Visit Date: 12/31/2021  Today's healthcare provider: Charlton Amor, DO   Chief Complaint  Patient presents with   Medication Management    SUBJECTIVE    Chief Complaint  Patient presents with   Medication Management   HPI  Pt presents for follow up on zoloft. She is currently on zoloft 75mg  nightly. She is doing very well on this medication. Denies suicidal/homicidal ideation   Review of Systems  Constitutional:  Negative for activity change, fatigue and fever.  Respiratory:  Negative for cough and shortness of breath.   Cardiovascular:  Negative for chest pain.  Gastrointestinal:  Negative for abdominal pain.  Genitourinary:  Negative for difficulty urinating.  Psychiatric/Behavioral:  Negative for self-injury and suicidal ideas. The patient is not nervous/anxious.        No outpatient medications have been marked as taking for the 12/31/21 encounter (Office Visit) with 01/02/22, DO.    OBJECTIVE    BP (!) 111/59   Pulse 74   Ht 5\' 6"  (1.676 m)   Wt 230 lb (104.3 kg)   SpO2 100%   BMI 37.12 kg/m   Physical Exam Vitals and nursing note reviewed.  Constitutional:      General: She is not in acute distress.    Appearance: Normal appearance.  HENT:     Head: Normocephalic and atraumatic.     Right Ear: External ear normal.     Left Ear: External ear normal.     Nose: Nose normal.  Eyes:     Conjunctiva/sclera: Conjunctivae normal.  Cardiovascular:     Rate and Rhythm: Normal rate.  Pulmonary:     Effort: Pulmonary effort is normal.  Neurological:     General: No focal deficit present.     Mental Status: She is alert and oriented to person, place, and time.  Psychiatric:        Mood and Affect: Mood normal.        Behavior: Behavior normal.        Thought Content: Thought content normal.        Judgment: Judgment normal.        ASSESSMENT & PLAN    Problem List Items Addressed This Visit       Other   Major depression, chronic    - continue zoloft 75mg  daily  - pt doing very well  - PHQ9: 2 - denies homicidal and suicidal ideation      Generalized anxiety disorder    - pt doing well on 75mg  zoloft, will continue - GAD7: 3        Other Visit Diagnoses     Need for COVID-19 vaccine    -  Primary   Relevant Orders   Pfizer Fall 2023 Covid-19 Vaccine 17yrs and older (Completed)   Need for vaccination for H flu type B       Relevant Orders   Flu Vaccine QUAD 6+ mos PF IM (Fluarix Quad PF) (Completed)       Return in about 6 months (around 07/01/2022).      No orders of the defined types were placed in this encounter.   Orders Placed This Encounter  Procedures   Flu Vaccine QUAD 6+ mos PF IM (Fluarix )   Pfizer Fall 2023 Covid-19 Vaccine 45yrs and older     07/03/2022, DO  [facility] [phone] (phone) [phone] (fax)  [facility]

## 2022-02-12 ENCOUNTER — Other Ambulatory Visit: Payer: Self-pay | Admitting: Family Medicine

## 2022-02-12 DIAGNOSIS — F411 Generalized anxiety disorder: Secondary | ICD-10-CM

## 2022-02-12 DIAGNOSIS — F329 Major depressive disorder, single episode, unspecified: Secondary | ICD-10-CM

## 2022-07-02 ENCOUNTER — Ambulatory Visit: Payer: No Typology Code available for payment source | Admitting: Family Medicine

## 2022-07-02 VITALS — BP 122/57 | HR 64 | Ht 66.0 in | Wt 240.5 lb

## 2022-07-02 DIAGNOSIS — Z79899 Other long term (current) drug therapy: Secondary | ICD-10-CM | POA: Diagnosis not present

## 2022-07-02 DIAGNOSIS — F329 Major depressive disorder, single episode, unspecified: Secondary | ICD-10-CM | POA: Diagnosis not present

## 2022-07-02 DIAGNOSIS — F411 Generalized anxiety disorder: Secondary | ICD-10-CM

## 2022-07-02 MED ORDER — SERTRALINE HCL 50 MG PO TABS: ORAL_TABLET | ORAL | 1 refills | Status: DC

## 2022-07-02 NOTE — Progress Notes (Signed)
     Established patient visit   Patient: Andrea Esparza   DOB: February 10, 1990   32 y.o. Female  MRN: [id number] Visit Date: 07/02/2022  Today's healthcare provider: Charlton Amor, DO   Chief Complaint  Patient presents with   Anxiety    Follow up - patient states he is going well on the sertraline as 75mg  once daily     SUBJECTIVE    Chief Complaint  Patient presents with   Anxiety    Follow up - patient states he is going well on the sertraline as 75mg  once daily    HPI  Pt presents for follow up on GAD and MDD. She is on zoloft 75mg . She is doing well with this.  Review of Systems  Constitutional:  Negative for activity change, fatigue and fever.  Respiratory:  Negative for cough and shortness of breath.   Cardiovascular:  Negative for chest pain.  Gastrointestinal:  Negative for abdominal pain.  Genitourinary:  Negative for difficulty urinating.        OBJECTIVE    BP (!) 122/57   Pulse 64   Ht 5\' 6"  (1.676 m)   Wt 240 lb 8 oz (109.1 kg)   SpO2 100%   BMI 38.82 kg/m   Physical Exam Vitals and nursing note reviewed.  Constitutional:      General: She is not in acute distress.    Appearance: Normal appearance.  HENT:     Head: Normocephalic and atraumatic.     Right Ear: External ear normal.     Left Ear: External ear normal.     Nose: Nose normal.  Eyes:     Conjunctiva/sclera: Conjunctivae normal.  Cardiovascular:     Rate and Rhythm: Normal rate and regular rhythm.  Pulmonary:     Effort: Pulmonary effort is normal.     Breath sounds: Normal breath sounds.  Neurological:     General: No focal deficit present.     Mental Status: She is alert and oriented to person, place, and time.  Psychiatric:        Mood and Affect: Mood normal.        Behavior: Behavior normal.        Thought Content: Thought content normal.        Judgment: Judgment normal.        ASSESSMENT & PLAN    Problem List Items Addressed This Visit       Other   Major  depression, chronic - Primary   Relevant Medications   sertraline (ZOLOFT) 50 MG tablet   Generalized anxiety disorder   Relevant Medications   sertraline (ZOLOFT) 50 MG tablet   Other Relevant Orders   COMPLETE METABOLIC PANEL WITH GFR    Return in about 6 months (around 01/02/2023).      Meds ordered this encounter  Medications   sertraline (ZOLOFT) 50 MG tablet    Sig: TAKE 1 AND 1/2 TABLET BY MOUTH EVERY NIGHT    Dispense:  135 tablet    Refill:  1    Orders Placed This Encounter  Procedures   COMPLETE METABOLIC PANEL WITH GFR     Charlton Amor, DO  [facility] at  [facility] [phone] (phone) [phone] (fax)  [facility]

## 2022-07-02 NOTE — Assessment & Plan Note (Signed)
-   refill med - tolerating well - get cmp to check liver function

## 2022-07-03 LAB — COMPLETE METABOLIC PANEL WITH GFR
AG Ratio: 1.7 (calc) (ref 1.0–2.5)
ALT: 17 U/L (ref 6–29)
AST: 15 U/L (ref 10–30)
Albumin: 4.3 g/dL (ref 3.6–5.1)
Alkaline phosphatase (APISO): 55 U/L (ref 31–125)
BUN: 12 mg/dL (ref 7–25)
CO2: 24 mmol/L (ref 20–32)
Calcium: 9.3 mg/dL (ref 8.6–10.2)
Chloride: 106 mmol/L (ref 98–110)
Creat: 0.91 mg/dL (ref 0.50–0.97)
Globulin: 2.5 g/dL (calc) (ref 1.9–3.7)
Glucose, Bld: 86 mg/dL (ref 65–99)
Potassium: 4.1 mmol/L (ref 3.5–5.3)
Sodium: 140 mmol/L (ref 135–146)
Total Bilirubin: 0.3 mg/dL (ref 0.2–1.2)
Total Protein: 6.8 g/dL (ref 6.1–8.1)
eGFR: 85 mL/min/{1.73_m2} (ref >=60)

## 2023-01-06 ENCOUNTER — Ambulatory Visit: Payer: No Typology Code available for payment source | Admitting: Family Medicine

## 2023-01-06 VITALS — BP 117/67 | HR 85 | Ht 66.0 in | Wt 242.5 lb

## 2023-01-06 DIAGNOSIS — Z23 Encounter for immunization: Secondary | ICD-10-CM | POA: Diagnosis not present

## 2023-01-06 DIAGNOSIS — F329 Major depressive disorder, single episode, unspecified: Secondary | ICD-10-CM

## 2023-01-06 DIAGNOSIS — F411 Generalized anxiety disorder: Secondary | ICD-10-CM

## 2023-01-06 MED ORDER — SERTRALINE HCL 50 MG PO TABS: 75.0000 mg | ORAL_TABLET | Freq: Every day | ORAL | 2 refills | Status: DC

## 2023-01-06 NOTE — Assessment & Plan Note (Signed)
Pt doing well, will refill no concerns

## 2023-01-06 NOTE — Progress Notes (Signed)
Established patient visit   Patient: Andrea Esparza   DOB: 12/05/1990   32 y.o. Female  MRN: [id number] Visit Date: 01/06/2023  Today's healthcare provider: Charlton Amor, DO   Chief Complaint  Patient presents with   Medical Management of Chronic Issues    MDD and anxiety     SUBJECTIVE    Chief Complaint  Patient presents with   Medical Management of Chronic Issues    MDD and anxiety    HPI HPI     Medical Management of Chronic Issues    Additional comments: MDD and anxiety       Last edited by Roselyn Reef, CMA on 01/06/2023  3:17 PM.       Pt presents for MDD and GAD follow up. Currently on zoloft 75mg  and doing well.   Review of Systems  Constitutional:  Negative for activity change, fatigue and fever.  Respiratory:  Negative for cough and shortness of breath.   Cardiovascular:  Negative for chest pain.  Gastrointestinal:  Negative for abdominal pain.  Genitourinary:  Negative for difficulty urinating.       Current Meds  Medication Sig   [DISCONTINUED] sertraline (ZOLOFT) 50 MG tablet TAKE 1 AND 1/2 TABLET BY MOUTH EVERY NIGHT    OBJECTIVE    BP 117/67 (BP Location: Left Arm, Patient Position: Sitting, Cuff Size: Large)   Pulse 85   Ht 5\' 6"  (1.676 m)   Wt 242 lb 8 oz (110 kg)   SpO2 99%   BMI 39.14 kg/m   Physical Exam Vitals reviewed.  Constitutional:      Appearance: She is well-developed.  HENT:     Head: Normocephalic and atraumatic.  Eyes:     Conjunctiva/sclera: Conjunctivae normal.  Cardiovascular:     Rate and Rhythm: Normal rate.  Pulmonary:     Effort: Pulmonary effort is normal.  Skin:    General: Skin is dry.     Coloration: Skin is not pale.  Neurological:     Mental Status: She is alert and oriented to person, place, and time.  Psychiatric:        Behavior: Behavior normal.        ASSESSMENT & PLAN    Problem List Items Addressed This Visit       Other   Major depression, chronic    Pt doing well,  will refill no concerns      Relevant Medications   sertraline (ZOLOFT) 50 MG tablet   Generalized anxiety disorder    Doing well, no concerns      Relevant Medications   sertraline (ZOLOFT) 50 MG tablet   Other Visit Diagnoses     Encounter for immunization    -  Primary   Relevant Orders   Pfizer Comirnaty Covid-19 Vaccine 76yrs & older (Completed)   Flu vaccine trivalent PF, 6mos and older(Flulaval,Afluria,Fluarix,Fluzone) (Completed)       Return in about 1 year (around 01/06/2024) for MDD and GAD.      Meds ordered this encounter  Medications   sertraline (ZOLOFT) 50 MG tablet    Sig: Take 1.5 tablets (75 mg total) by mouth at bedtime. TAKE 1 AND 1/2 TABLET BY MOUTH EVERY NIGHT    Dispense:  135 tablet    Refill:  2    Orders Placed This Encounter  Procedures   Pfizer Comirnaty Covid-19 Vaccine 54yrs & older   Flu vaccine trivalent PF, 6mos and older(Flulaval,Afluria,Fluarix,Fluzone)     Colbert Coyer  Tresa Moore  [facility] & Sports Medicine at [facility] [phone] (phone) [phone] (fax)  [facility]

## 2023-01-06 NOTE — Assessment & Plan Note (Signed)
Doing well, no concerns.

## 2023-05-13 ENCOUNTER — Ambulatory Visit: Admitting: Medical-Surgical

## 2023-05-13 VITALS — BP 134/77 | HR 97 | Resp 20 | Ht 66.0 in | Wt 243.0 lb

## 2023-05-13 DIAGNOSIS — Z3009 Encounter for other general counseling and advice on contraception: Secondary | ICD-10-CM | POA: Diagnosis not present

## 2023-05-13 NOTE — Progress Notes (Signed)
        Established patient visit  History, exam, impression, and plan:  1. Encounter for counseling regarding contraception (Primary) Very pleasant 33 year old female presenting today for discussion of birth control counseling.  She reports having IUD present since she was approximately 33 years old.  She did well with an IUD and had no menstrual bleeding at all.  About a year and a half ago, she and her husband decided they wanted to try for a baby.  They have had some difficulties and have had to talk to fertility specialists.  They are also experiencing some marital difficulties which has been very stressful.  At this point, she has decided that she does not want to bring the child into the world with such marital stress going on as well as with the current environment.  She would like to restart birth control to prevent an unplanned pregnancy.  Remembers using oral contraceptives in the past in her teenage years but admits that she is not great with taking pills every day at the same time.  We briefly reviewed options including COC's, POPs, patches, spermicides, condoms, cervical caps, diaphragms, nexplanon, and IUDs.  She has a non-smoker and does not have a history of migraine with aura so she would qualify for use of any of these options.  She is considering having a Nexplanon placed versus an IUD.  She will review these options and let us know if she would like to proceed with having one placed.  Procedures performed this visit: None.  Return if symptoms worsen or fail to improve.  __________________________________ Thayer Ohm, DNP, APRN, FNP-BC Primary Care and Sports Medicine [facility] [place]

## 2023-05-30 ENCOUNTER — Encounter: Payer: Self-pay | Admitting: Medical-Surgical

## 2023-06-02 NOTE — Telephone Encounter (Signed)
Schedule changed

## 2023-06-05 ENCOUNTER — Telehealth: Payer: Self-pay

## 2023-06-05 ENCOUNTER — Ambulatory Visit: Admitting: Medical-Surgical

## 2023-06-05 ENCOUNTER — Encounter: Payer: Self-pay | Admitting: Medical-Surgical

## 2023-06-05 VITALS — BP 105/70 | HR 94 | Resp 20 | Ht 66.0 in | Wt 230.1 lb

## 2023-06-05 DIAGNOSIS — F329 Major depressive disorder, single episode, unspecified: Secondary | ICD-10-CM | POA: Diagnosis not present

## 2023-06-05 DIAGNOSIS — G479 Sleep disorder, unspecified: Secondary | ICD-10-CM

## 2023-06-05 DIAGNOSIS — Z30430 Encounter for insertion of intrauterine contraceptive device: Secondary | ICD-10-CM

## 2023-06-05 DIAGNOSIS — F411 Generalized anxiety disorder: Secondary | ICD-10-CM | POA: Diagnosis not present

## 2023-06-05 LAB — POCT URINE PREGNANCY: Preg Test, Ur: NEGATIVE

## 2023-06-05 MED ORDER — LEVONORGESTREL 20 MCG/DAY IU IUD
1.0000 | INTRAUTERINE_SYSTEM | Freq: Once | INTRAUTERINE | Status: AC
  Administered 2023-06-05: 1 via INTRAUTERINE

## 2023-06-05 MED ORDER — SERTRALINE HCL 100 MG PO TABS: 100.0000 mg | ORAL_TABLET | Freq: Every day | ORAL | 1 refills | Status: DC

## 2023-06-05 MED ORDER — TRAZODONE HCL 50 MG PO TABS: 25.0000 mg | ORAL_TABLET | Freq: Every evening | ORAL | 3 refills | Status: DC | PRN

## 2023-06-05 MED ORDER — SERTRALINE HCL 100 MG PO TABS
100.0000 mg | ORAL_TABLET | Freq: Every day | ORAL | 1 refills | Status: DC
Start: 2023-06-05 — End: 2023-06-05

## 2023-06-05 NOTE — Telephone Encounter (Signed)
 Copied from CRM [id number]. Topic: Clinical - Prescription Issue >> Jun 05, 2023  3:28 PM Blair Bumpers wrote: Reason for CRM: Andrea Esparza, with [facility], called and stated they just received a prescription for sertraline  (ZOLOFT ) 100 MG tablet. He states it has two sets of directions on it. Please give him a call to advise. CB #: [id number].

## 2023-06-05 NOTE — Progress Notes (Signed)
        Established patient visit  History, exam, impression, and plan:  1. Encounter for IUD insertion (Primary) UPT negative. See below for procedure note.  - POCT urine pregnancy  2. Major depression, chronic 3. Generalized anxiety disorder Pleasant 33 year old female presenting today with a history of depression and anxiety.  She is currently doing counseling regularly and notes that her therapist told her she should probably consider getting on an antidepressant medication.  She is currently taking Zoloft  75 mg daily, tolerating well without side effects.  Has been on this medication off and on over the years with good result.  Right now she has a lot of situational issues with marital stressors as well as every day life.  Notes that she has been feeling sad since January.  Denies SI/HI.  She is interested in adding medication or adjusting her current dose to help with her symptoms.  Discussed her current regimen as well as medications that have been tried in the past.  She remembers being prescribed Wellbutrin  in the past to help with smoking cessation but does not remember if she took it long enough to notice if it made a difference or not.  Considered adding a low-dose Wellbutrin  versus dose adjustment on the Zoloft  versus switch to a different SSRI.  Since that she is doing well on Zoloft  and has no intolerances or side effects, we will maximize Zoloft  dosing first.  Increasing to 100 mg nightly.  Plan close follow-up in 4-6 weeks for evaluation of tolerance and response to the higher dose. - sertraline  (ZOLOFT ) 100 MG tablet; Take 1 tablet (100 mg total) by mouth at bedtime.   Dispense: 90 tablet; Refill: 1  4. Trouble in sleeping Reports that she is having difficulty with sleeping and her therapist recommended she speak with her PCPs office regarding medication that may help.  She is currently taking delta 8 Gummies nightly which are helpful but would like to get off these if possible.   Tried over-the-counter medications such as Unisom in the past with poor tolerance as she felt shaky and groggy the next day.  No previous prescribed medications for sleep.  With concurrent anxiety and depression, plan to do a small increase of Zoloft  but will add trazodone  25-50 mg nightly as needed for sleep.   IUD PROCEDURE NOTE  PERTINENT RESULTS REVIEWED: PREGNANCY TEST PRIOR TO PROCEDURE: Negative GONORRHEA/CHLAMYDIA SCREEN: Not Available  PRIOR TO PROCEDURE: INFORMED CONSENT OBTAINED: yes SEE SCANNED DOCUMENTS ANY PRETREATMENT: no  PHYSICAL EXAM: GYN: No lesions/ulcers to external genitalia, normal urethra, normal vaginal mucosa, physiologic discharge, cervix normal without lesions, uterus not enlarged or tender, adnexa no masses and nontender  DESCRIPTION OF PROCEDURE: Vaginal speculum placed. Cervix and proximal vagina cleaned with Betadine.Tenaculum applied at 12:00 cervical position and gentle traction applied. Uterus sounded to 7 cm (greater than 6cm). IUD placed without difficulty. IUD threads cut to 2-3cm from cervical os. Tenaculum and speculum removed. Patient felt strings. Patient tolerated procedure well. Sterile technique maintained.   IUD INFORMATION: BRAND: Mirena  LOT NUMBER: [id number] CARD GIVEN TO PATIENT: yes  Return in about 6 weeks (around 07/17/2023) for IUD string check/mood follow up.  __________________________________ Maryl Snook, DNP, APRN, FNP-BC Primary Care and Sports Medicine [facility] [place]

## 2023-06-05 NOTE — Telephone Encounter (Signed)
 This request has been handled. No further action is required. Updated rx sent to the pharmacy.

## 2023-06-05 NOTE — Patient Instructions (Signed)
IUD AFTER-CARE INSTRUCTIONS: READ THOROUGHLY  Your Mirena IUD is currently approved to remain in place for 8 years. At that time, if you wish to receive a new IUD, this can be placed when your current one is removed. If you wish to remove your IUD at any time, for any reason, this can be done easily by your doctor.   You should feel for the strings to your IUD routinely. If you cannot locate the strings, it is recommended you alert your doctor of this.   Be aware that in the first few weeks, your new IUD may cause some discomfort/cramping as it settles into place in your uterus. To ease discomfort, you may apply heating pad to abdomen and take Ibuprofen 800mg by mouth every 6 hours as needed, but avoid using this dose continuously for more than 5 days. Walking helps as well. You can also expect some irregular bleeding but it should not be heavy for more than a few days.   If pain is severe or if severe bleeding occurs, or if foul-smelling discharge or fever develops, or if you have any other concerns - contact your doctor right away or go to the Emergency Room.  IUD's are a very reliable method of birth control, but no method is 100% effective. If you think you may be pregnant, see your doctor right away.   An IUD will not protect you from sexually transmitted infections such as HIV, gonorrhea, chlamydia, HPV and others.   It is recommended that you see your doctor as directed for routine well-woman care, which includes Pap testing and may include screening for infections.   

## 2023-06-12 ENCOUNTER — Encounter: Payer: Self-pay | Admitting: Family Medicine

## 2023-07-15 ENCOUNTER — Ambulatory Visit: Admitting: Medical-Surgical

## 2023-07-15 ENCOUNTER — Encounter: Payer: Self-pay | Admitting: Medical-Surgical

## 2023-07-15 VITALS — BP 117/70 | HR 88 | Resp 20 | Ht 66.0 in

## 2023-07-15 DIAGNOSIS — F329 Major depressive disorder, single episode, unspecified: Secondary | ICD-10-CM

## 2023-07-15 DIAGNOSIS — G479 Sleep disorder, unspecified: Secondary | ICD-10-CM | POA: Insufficient documentation

## 2023-07-15 DIAGNOSIS — F900 Attention-deficit hyperactivity disorder, predominantly inattentive type: Secondary | ICD-10-CM

## 2023-07-15 DIAGNOSIS — Z30431 Encounter for routine checking of intrauterine contraceptive device: Secondary | ICD-10-CM

## 2023-07-15 DIAGNOSIS — F411 Generalized anxiety disorder: Secondary | ICD-10-CM

## 2023-07-15 MED ORDER — METHYLPHENIDATE HCL ER (OSM) 18 MG PO TBCR: 18.0000 mg | EXTENDED_RELEASE_TABLET | ORAL | 0 refills | Status: DC

## 2023-07-15 NOTE — Progress Notes (Unsigned)
        Established patient visit  History, exam, impression, and plan:  1. Major depression, chronic (Primary) ***  2. Generalized anxiety disorder ***  3. Trouble in sleeping ***  4. IUD check up ***  5. Attention deficit hyperactivity disorder (ADHD), predominantly inattentive type *** - methylphenidate (CONCERTA) 18 MG PO CR tablet; Take 1 tablet (18 mg total) by mouth every morning.  Dispense: 30 tablet; Refill: 0   Procedures performed this visit: None.  No follow-ups on file.  __________________________________ Maryl Snook, DNP, APRN, FNP-BC Primary Care and Sports Medicine [facility] [place]

## 2023-08-13 NOTE — Progress Notes (Unsigned)
 Virtual Visit via Video Note  I connected with Andrea Esparza on 08/14/23 at  9:10 AM EDT by a video enabled telemedicine application and verified that I am speaking with the correct person using two identifiers.   I discussed the limitations of evaluation and management by telemedicine and the availability of in person appointments. The patient expressed understanding and agreed to proceed.  Patient location: home Provider locations: office  Subjective:    Discussed the use of AI scribe software for clinical note transcription with the patient, who gave verbal consent to proceed.  History of Present Illness   Andrea Esparza is a 33 year old female who presents for follow-up on Concerta  medication for ADHD.  Attention deficit and executive dysfunction symptoms - Takes Concerta  18 mg daily for ADHD - Significant improvement in focus and executive dysfunction since starting medication but not as helpful over the last couple of weeks - Skips doses occasionally, which leads to increased chaos and difficulty managing daily tasks  Medication effectiveness and tolerability - Initially experienced decreased appetite and nausea, both of which have resolved - Initial mild sleep disturbance, now stabilized - Recently experiencing decreased medication effectiveness, with increased irritability in the afternoons, suggesting medication wears off before end of day     Past medical history, Surgical history, Family history not pertinant except as noted below, Social history, Allergies, and medications have been entered into the medical record, reviewed, and corrections made.   Review of Systems: See HPI for pertinent positives and negatives.   Objective:    General: Speaking clearly in complete sentences without any shortness of breath.  Alert and oriented x3.  Normal judgment. No apparent acute distress.  Impression and Recommendations:    Assessment and Plan     Attention-Deficit/Hyperactivity Disorder (ADHD) Current Concerta  dose suboptimal with decreased effectiveness and increased afternoon irritability. - Increase Concerta  to 36 mg daily. - [facility] [place] [facility] [address]. - Advise monitoring for intolerable side effects and contact if necessary. - Arrange virtual follow-up in four weeks to assess response.     I discussed the assessment and treatment plan with the patient. The patient was provided an opportunity to ask questions and all were answered. The patient agreed with the plan and demonstrated an understanding of the instructions.   The patient was advised to call back or seek an in-person evaluation if the symptoms worsen or if the condition fails to improve as anticipated.  Return in about 4 weeks (around 09/11/2023) for ADHD follow up.  Zada FREDRIK Palin, DNP, APRN, FNP-BC [facility] and Sports Medicine

## 2023-08-14 ENCOUNTER — Telehealth: Admitting: Medical-Surgical

## 2023-08-14 ENCOUNTER — Encounter: Payer: Self-pay | Admitting: Medical-Surgical

## 2023-08-14 DIAGNOSIS — F900 Attention-deficit hyperactivity disorder, predominantly inattentive type: Secondary | ICD-10-CM

## 2023-08-14 MED ORDER — METHYLPHENIDATE HCL ER (OSM) 36 MG PO TBCR
36.0000 mg | EXTENDED_RELEASE_TABLET | Freq: Every day | ORAL | 0 refills | Status: DC
Start: 2023-08-14 — End: 2023-09-15

## 2023-09-04 ENCOUNTER — Other Ambulatory Visit: Payer: Self-pay | Admitting: Medical-Surgical

## 2023-09-15 ENCOUNTER — Encounter: Payer: Self-pay | Admitting: Medical-Surgical

## 2023-09-15 ENCOUNTER — Telehealth: Admitting: Medical-Surgical

## 2023-09-15 DIAGNOSIS — F900 Attention-deficit hyperactivity disorder, predominantly inattentive type: Secondary | ICD-10-CM | POA: Diagnosis not present

## 2023-09-15 MED ORDER — METHYLPHENIDATE HCL ER (OSM) 54 MG PO TBCR: 54.0000 mg | EXTENDED_RELEASE_TABLET | ORAL | 0 refills | Status: DC

## 2023-09-15 NOTE — Progress Notes (Signed)
 Virtual Visit via Video Note  I connected with Andrea Esparza on 09/15/23 at  1:40 PM EDT by a video enabled telemedicine application and verified that I am speaking with the correct person using two identifiers.   I discussed the limitations of evaluation and management by telemedicine and the availability of in person appointments. The patient expressed understanding and agreed to proceed.  Patient location: home Provider locations: office  Subjective:    CC: ADHD follow up  HPI: Andrea Esparza is a 33 year old female who presents for follow-up on Concerta  treatment.  She has been on Concerta , starting at 18 mg for four weeks, then increased to 36 mg for another four weeks. Initial improvement in focus and reduced distraction was noted, but the effects diminished after 2 weeks. She tolerates Concerta  well without significant side effects and takes breaks from the medication on some weekends. Assessment & Plan  Past medical history, Surgical history, Family history not pertinant except as noted below, Social history, Allergies, and medications have been entered into the medical record, reviewed, and corrections made.   Review of Systems: See HPI for pertinent positives and negatives.   Objective:    General: Speaking clearly in complete sentences without any shortness of breath.  Alert and oriented x3.  Normal judgment. No apparent acute distress.  Impression and Recommendations:    Attention-deficit hyperactivity disorder (ADHD) Concerta  36 mg initially effective but lost efficacy after two weeks. Well tolerated without significant side effects. - Increase Concerta  to 54 mg daily. - Monitor response over four weeks. - Contact provider if ineffective or issues arise with medication access.  I discussed the assessment and treatment plan with the patient. The patient was provided an opportunity to ask questions and all were answered. The patient agreed with the plan and  demonstrated an understanding of the instructions.   The patient was advised to call back or seek an in-person evaluation if the symptoms worsen or if the condition fails to improve as anticipated.  Return in about 4 weeks (around 10/13/2023) for ADHD follow up.  Zada FREDRIK Palin, DNP, APRN, FNP-BC [facility] and Sports Medicine

## 2023-09-16 ENCOUNTER — Encounter: Payer: Self-pay | Admitting: Medical-Surgical

## 2023-09-17 MED ORDER — LISDEXAMFETAMINE DIMESYLATE 20 MG PO CAPS: 20.0000 mg | ORAL_CAPSULE | ORAL | 0 refills | Status: DC

## 2023-10-21 ENCOUNTER — Telehealth: Admitting: Medical-Surgical

## 2023-10-21 ENCOUNTER — Ambulatory Visit: Admitting: Medical-Surgical

## 2023-10-21 VITALS — BP 131/82

## 2023-10-21 DIAGNOSIS — F900 Attention-deficit hyperactivity disorder, predominantly inattentive type: Secondary | ICD-10-CM | POA: Diagnosis not present

## 2023-10-21 DIAGNOSIS — F411 Generalized anxiety disorder: Secondary | ICD-10-CM | POA: Diagnosis not present

## 2023-10-21 DIAGNOSIS — F329 Major depressive disorder, single episode, unspecified: Secondary | ICD-10-CM | POA: Diagnosis not present

## 2023-10-21 MED ORDER — LISDEXAMFETAMINE DIMESYLATE 30 MG PO CAPS
30.0000 mg | ORAL_CAPSULE | Freq: Every day | ORAL | 0 refills | Status: DC
Start: 2023-10-21 — End: 2023-12-11

## 2023-10-21 NOTE — Progress Notes (Signed)
 Virtual Visit via Video Note  I connected with Andrea Esparza on 10/21/23 at  2:00 PM EDT by a video enabled telemedicine application and verified that I am speaking with the correct person using two identifiers.   I discussed the limitations of evaluation and management by telemedicine and the availability of in person appointments. The patient expressed understanding and agreed to proceed.  Patient location: home Provider locations: office  Subjective:    CC: ADHD  HPI: Pleasant 33 year old female presenting via MyChart video visit with a history of ADHD.  At her last visit, we started Vyvanse  20 mg daily which she has been taking as prescribed, tolerating well without side effects.  No sleep disruption, appetite suppression, or weight fluctuations.  Felt the medication was effective up until the last week or so when it seemed to be less effective than before.  Has not had any paresthesias outside of the known nerve damage in her foot.  Currently taking sertraline  100 mg daily but notes that she is not sure how much the medication is doing for her.  Has given thought to weaning off of the medication to see how her anxiety and depression are at this point however she is hesitant given the political climate at this time.   Past medical history, Surgical history, Family history not pertinant except as noted below, Social history, Allergies, and medications have been entered into the medical record, reviewed, and corrections made.   Review of Systems: See HPI for pertinent positives and negatives.   Objective:    General: Speaking clearly in complete sentences without any shortness of breath.  Alert and oriented x3.  Normal judgment. No apparent acute distress.  Impression and Recommendations:    1. Attention deficit hyperactivity disorder (ADHD), predominantly inattentive type (Primary) Vyvanse  has been well-tolerated to date however the 20 mg dose seems to have lost effectiveness.   Plan to increase Vyvanse  to 30 mg daily and follow-up in 1 month.  2. Generalized anxiety disorder 3. Major depression, chronic Discussed the use of sertraline .  Okay to stay on sertraline  as prescribed but if she would prefer, okay to taper off of the medication slowly by reducing the dose to 50 mg daily for a couple of weeks then further reducing to 50 mg every other day for a couple of weeks.  I discussed the assessment and treatment plan with the patient. The patient was provided an opportunity to ask questions and all were answered. The patient agreed with the plan and demonstrated an understanding of the instructions.   The patient was advised to call back or seek an in-person evaluation if the symptoms worsen or if the condition fails to improve as anticipated.  Return in about 4 weeks (around 11/18/2023) for ADHD follow up.  Zada FREDRIK Palin, DNP, APRN, FNP-BC [facility]

## 2023-12-10 ENCOUNTER — Other Ambulatory Visit: Payer: Self-pay | Admitting: Medical-Surgical

## 2023-12-10 DIAGNOSIS — F329 Major depressive disorder, single episode, unspecified: Secondary | ICD-10-CM

## 2023-12-10 DIAGNOSIS — F411 Generalized anxiety disorder: Secondary | ICD-10-CM

## 2023-12-11 ENCOUNTER — Telehealth: Admitting: Medical-Surgical

## 2023-12-11 ENCOUNTER — Encounter: Payer: Self-pay | Admitting: Medical-Surgical

## 2023-12-11 VITALS — BP 122/86 | HR 80

## 2023-12-11 DIAGNOSIS — F329 Major depressive disorder, single episode, unspecified: Secondary | ICD-10-CM | POA: Diagnosis not present

## 2023-12-11 DIAGNOSIS — F411 Generalized anxiety disorder: Secondary | ICD-10-CM | POA: Diagnosis not present

## 2023-12-11 DIAGNOSIS — F900 Attention-deficit hyperactivity disorder, predominantly inattentive type: Secondary | ICD-10-CM | POA: Diagnosis not present

## 2023-12-11 MED ORDER — SERTRALINE HCL 25 MG PO TABS
25.0000 mg | ORAL_TABLET | Freq: Every day | ORAL | 1 refills | Status: AC
Start: 2023-12-11 — End: ?

## 2023-12-11 MED ORDER — LISDEXAMFETAMINE DIMESYLATE 30 MG PO CAPS: 30.0000 mg | ORAL_CAPSULE | Freq: Every day | ORAL | 0 refills | Status: AC

## 2023-12-11 NOTE — Progress Notes (Signed)
 Virtual Visit via Video Note  I connected with Andrea Esparza on 12/11/23 at  2:00 PM EST by a video enabled telemedicine application and verified that I am speaking with the correct person using two identifiers.   I discussed the limitations of evaluation and management by telemedicine and the availability of in person appointments. The patient expressed understanding and agreed to proceed.  Patient location: home Provider locations: office  Subjective:    Discussed the use of AI scribe software for clinical note transcription with the patient, who gave verbal consent to proceed.  History of Present Illness   Andrea Esparza is a 33 year old female who presents for follow-up on her medications.  Attention and focus - Vyvanse  30 mg daily improves focus without causing overstimulation - No significant side effects from Vyvanse   Mood and serotonergic medication taper - Tapering Zoloft  to 50 mg and would like to go down to 25 mg  - Considering further reduction to 12.5 mg if needed - No thoughts of self-harm or harm to others  Contraception - IUD placed in June remains in situ despite a system error indicating removal  Sleep - Sleep quality is good  Allergies and new medical problems - No new allergies - No new medical problems     Past medical history, Surgical history, Family history not pertinant except as noted below, Social history, Allergies, and medications have been entered into the medical record, reviewed, and corrections made.   Review of Systems: See HPI for pertinent positives and negatives.   Objective:    General: Speaking clearly in complete sentences without any shortness of breath.  Alert and oriented x3.  Normal judgment. No apparent acute distress.  Impression and Recommendations:    1. Attention deficit hyperactivity disorder (ADHD), predominantly inattentive type (Primary) Doing well on the new dose. Continue Vyvanse  30mg  daily.   2. Major  depression, chronic 3. Generalized anxiety disorder Reduce sertraline  to 25mg  daily. If further reduction desired, consider taking 1/2 tablet daily (12.5mg ). - sertraline  (ZOLOFT ) 25 MG tablet; Take 1 tablet (25 mg total) by mouth at bedtime.  Dispense: 90 tablet; Refill: 1  I discussed the assessment and treatment plan with the patient. The patient was provided an opportunity to ask questions and all were answered. The patient agreed with the plan and demonstrated an understanding of the instructions.   The patient was advised to call back or seek an in-person evaluation if the symptoms worsen or if the condition fails to improve as anticipated.  Return in about 3 months (around 03/12/2024) for ADHD follow up.  Zada FREDRIK Palin, DNP, APRN, FNP-BC [facility]

## 2024-01-07 ENCOUNTER — Ambulatory Visit: Payer: No Typology Code available for payment source | Admitting: Family Medicine
